# Patient Record
Sex: Male | Born: 1965
Health system: Southern US, Community
[De-identification: ages and names within clinical notes are randomized; demographics above are authoritative.]

## PROBLEM LIST (undated history)

## (undated) DIAGNOSIS — I10 Essential (primary) hypertension: Secondary | ICD-10-CM

## (undated) DIAGNOSIS — R55 Syncope and collapse: Secondary | ICD-10-CM

## (undated) HISTORY — PX: VASECTOMY: SHX75

## (undated) HISTORY — PX: APPENDECTOMY: SHX54

## (undated) HISTORY — DX: Syncope and collapse: R55

---

## 2000-12-21 ENCOUNTER — Emergency Department (HOSPITAL_COMMUNITY): Admission: EM | Admit: 2000-12-21 | Discharge: 2000-12-21 | Payer: Self-pay | Admitting: Emergency Medicine

## 2001-07-31 ENCOUNTER — Other Ambulatory Visit: Admission: RE | Admit: 2001-07-31 | Discharge: 2001-07-31 | Payer: Self-pay | Admitting: Urology

## 2012-02-19 DIAGNOSIS — R55 Syncope and collapse: Secondary | ICD-10-CM | POA: Insufficient documentation

## 2012-02-19 HISTORY — DX: Syncope and collapse: R55

## 2012-02-27 HISTORY — PX: TRANSTHORACIC ECHOCARDIOGRAM: SHX275

## 2013-05-27 ENCOUNTER — Other Ambulatory Visit: Payer: Self-pay | Admitting: Family Medicine

## 2013-05-27 DIAGNOSIS — M542 Cervicalgia: Secondary | ICD-10-CM

## 2013-05-27 DIAGNOSIS — M5412 Radiculopathy, cervical region: Secondary | ICD-10-CM

## 2013-06-01 ENCOUNTER — Ambulatory Visit
Admission: RE | Admit: 2013-06-01 | Discharge: 2013-06-01 | Disposition: A | Discharge status: Home / Self Care | Source: Ambulatory Visit | Attending: Family Medicine | Admitting: Family Medicine

## 2013-06-01 DIAGNOSIS — M5412 Radiculopathy, cervical region: Secondary | ICD-10-CM

## 2013-06-01 DIAGNOSIS — M542 Cervicalgia: Secondary | ICD-10-CM

## 2013-12-28 ENCOUNTER — Encounter: Payer: Self-pay | Admitting: Cardiovascular Disease

## 2013-12-28 ENCOUNTER — Ambulatory Visit (INDEPENDENT_AMBULATORY_CARE_PROVIDER_SITE_OTHER): Admitting: Cardiovascular Disease

## 2013-12-28 VITALS — BP 116/84 | HR 64 | Resp 16 | Ht 71.0 in | Wt 200.3 lb

## 2013-12-28 DIAGNOSIS — R000 Tachycardia, unspecified: Secondary | ICD-10-CM

## 2013-12-28 DIAGNOSIS — R002 Palpitations: Secondary | ICD-10-CM

## 2013-12-28 NOTE — Patient Instructions (Signed)
Your physician has recommended that you wear an event monitor for 30 days.. Event monitors are medical devices that record the heart's electrical activity. Doctors most often [place] these monitors to diagnose arrhythmias. Arrhythmias are problems with the speed or rhythm of the heartbeat. The monitor is a small, portable device. You can wear one while you do your normal daily activities. This is usually used to diagnose what is causing palpitations/syncope (passing out).   Your physician recommends that you schedule a follow-up appointment in: AS NEEDED

## 2013-12-29 DIAGNOSIS — R002 Palpitations: Secondary | ICD-10-CM | POA: Insufficient documentation

## 2013-12-29 NOTE — Progress Notes (Signed)
Patient ID: Juan Frank, male   DOB: Sep 17, 1966, 48 y.o.   MRN: [id number]     Reason for office visit Palpitations  Juan Frank returns with complaints similar to those from 2 years ago. He was out of town on a work-related trip and was sitting down to have lunch when he expressed very abrupt onset of a rapid heartbeat. He felt his heart was racing he believes the rhythm was regular. He did not have dizziness or syncope but felt drained of energy. He did not feel short of breath or have any chest discomfort. The symptoms when on all evening and he was still not feeling well the following morning. He did not check his heart rate when the symptoms began but the following morning his heart rate was 80 beats per minute. While this is not an abnormal value it is different from his usual heart rate of 50-60 beats per minute. He had taken Flexeril for a stiff neck about 36 hours earlier but otherwise he has been in good health and has no other complaints. Right now he is back to normal. During this time he has continued to exercise regularly and never feels poorly while he is on the treadmill or lifting weights. 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  May of 2013 he wore a 24-hour Holter monitor which was completely normal  Allergies  Allergen Reactions  . Penicillins Rash    Current Outpatient Prescriptions  Medication Sig Dispense Refill  . cyclobenzaprine (FLEXERIL) 10 MG tablet Take 10 mg by mouth 3 (three) times daily as needed for muscle spasms.      . naproxen sodium (ANAPROX) 220 MG tablet Take 220 mg by mouth as needed.       No current facility-administered medications for this visit.    No past medical illness  Surgical history significant for appendectomy and vasectomy  Family history significant for diabetes in his mother and sister. His sister also had stroke and syndrome his maternal grandmother had a stroke in her late 65s  History   Social History  .  Marital Status:  married, 2 children     Spouse Name: N/A    Number of Children: N/A  . Years of Education: N/A   Occupational History  . Not on file.   Social History Main Topics  . Smoking status: Never Smoker   . Smokeless tobacco: No  . Alcohol Use: No  . Drug Use: No  . Sexual Activity: Not on file    Review of systems: The patient specifically denies any chest pain at rest or with exertion, dyspnea at rest or with exertion, orthopnea, paroxysmal nocturnal dyspnea, syncope, focal neurological deficits, intermittent claudication, lower extremity edema, unexplained weight gain, cough, hemoptysis or wheezing.  The patient also denies abdominal pain, nausea, vomiting, dysphagia, diarrhea, constipation, polyuria, polydipsia, dysuria, hematuria, frequency, urgency, abnormal bleeding or bruising, fever, chills, unexpected weight changes, mood swings, change in skin or hair texture, change in voice quality, auditory or visual problems, allergic reactions or rashes, new musculoskeletal complaints other than usual "aches and pains".   PHYSICAL EXAM BP 116/84  Pulse 64  Resp 16  Ht 5\' 11"  (1.803 m)  Wt 90.855 kg (200 lb 4.8 oz)  BMI 27.95 kg/m2  General: Alert, oriented x3, no distress Head: no evidence of trauma, PERRL, EOMI, no exophtalmos or lid lag, no myxedema, no xanthelasma; normal ears, nose and oropharynx Neck: normal jugular venous pulsations and no hepatojugular reflux;  brisk carotid pulses without delay and no carotid bruits Chest: clear to auscultation, no signs of consolidation by percussion or palpation, normal fremitus, symmetrical and full respiratory excursions Cardiovascular: normal position and quality of the apical impulse, regular rhythm, normal first and second heart sounds, no murmurs, rubs or gallops Abdomen: no tenderness or distention, no masses by palpation, no abnormal pulsatility or arterial bruits, normal bowel sounds, no hepatosplenomegaly Extremities: no  clubbing, cyanosis or edema; 2+ radial, ulnar and brachial pulses bilaterally; 2+ right femoral, posterior tibial and dorsalis pedis pulses; 2+ left femoral, posterior tibial and dorsalis pedis pulses; no subclavian or femoral bruits Neurological: grossly nonfocal   EKG: Sinus rhythm, normal   ASSESSMENT AND PLAN  Review of of the abrupt onset of his palpitations, Juan Frank may indeed have an arrhythmia. I recommended that he wore a 30 day event monitor. This may be nondiagnostic since his episodes occur less than once a year. I do not think the symptoms are severe enough to justify an implantable loop recorder. I see no reason to restrict his physical activity. He had a normal echocardiogram in 2013 without any evidence of structural heart disease. It also does not appear to be justified to put him on any pharmacological treatment. We'll discuss further workup based on the findings on the monitor.  Orders Placed This Encounter  Procedures  . EKG 12-Lead  . Cardiac event monitor   Meds ordered this encounter  Medications  . cyclobenzaprine (FLEXERIL) 10 MG tablet    Sig: Take 10 mg by mouth 3 (three) times daily as needed for muscle spasms.  . naproxen sodium (ANAPROX) 220 MG tablet    Sig: Take 220 mg by mouth as needed.    Holli Humbles, MD, [place] [phone] office [phone] pager

## 2014-02-02 ENCOUNTER — Other Ambulatory Visit: Payer: Self-pay | Admitting: *Deleted

## 2014-02-02 DIAGNOSIS — R000 Tachycardia, unspecified: Secondary | ICD-10-CM

## 2014-02-02 DIAGNOSIS — R002 Palpitations: Secondary | ICD-10-CM

## 2015-03-22 ENCOUNTER — Encounter: Payer: Self-pay | Admitting: *Deleted

## 2015-03-28 ENCOUNTER — Encounter: Payer: Self-pay | Admitting: Cardiovascular Disease

## 2015-08-30 ENCOUNTER — Encounter: Payer: Self-pay | Admitting: Cardiovascular Disease

## 2015-08-30 ENCOUNTER — Ambulatory Visit (INDEPENDENT_AMBULATORY_CARE_PROVIDER_SITE_OTHER): Admitting: Cardiovascular Disease

## 2015-08-30 VITALS — BP 102/80 | HR 70 | Resp 16 | Ht 71.0 in | Wt 199.3 lb

## 2015-08-30 DIAGNOSIS — R55 Syncope and collapse: Secondary | ICD-10-CM

## 2015-08-30 DIAGNOSIS — R002 Palpitations: Secondary | ICD-10-CM | POA: Diagnosis not present

## 2015-08-30 NOTE — Progress Notes (Signed)
Patient ID: Juan Frank, male   DOB: 11/17/1965, 49 y.o.   MRN: [id number]     Cardiology Office Note   Date:  08/30/2015   ID:  Juan Frank, DOB [birth year], MRN [id number]  PCP:  Juan Frees, MD  Cardiologist:   Juan Klein, MD   Chief Complaint  Patient presents with  . Follow-up  . Chest Pain    a little soreness but states it may be coming from his neck  . Shortness of Breath    a little, sometimes light headedness and dizzy  . Edema    no edema      History of Present Illness: Juan Frank is a 49 y.o. male who presents for similar complaints as in the past : Near-syncope and palpitations. He was driving to pick his daughter up from college on Thanksgiving day when he noticed his heart rate increasing. The symptoms got progressively worse and he felt "drained". When he reached her home he had to sit down since he felt so weak. His heart rate which she reports is usually around 60 bpm was as high as 97 bpm when he measured it with his smart phone app. He  Try to lie down but felt even worse with his heart rate accelerating further. He called 911 but then gradually began to improve before they arrived and he told him to turn around.  He reports that his palpitations began abruptly while he was in the car , but then they resolved gradually. That morning he had not had breakfast although he had had coffee and drank a diet soda. This is not atypical for him in the mornings.  A similar event occurred several months ago after he had been working out in the yard for a few hours.  It was associated with severe dizziness but he did not lose consciousness.  His wife states that he looks drained for a day or 2 after one of these events, although he reports feeling back to normal quickly.   Juan Frank had an extensive workup in 2015 with treadmill stress testing, echocardiography and an event monitor, all results unrevealing. He is very physically active and fit and exercises  regularly. He does not have any symptoms while exercising , although on one occasion he felt poorly when he stopped exercising.   he has recently had some issues with deep sleep and dreams from which is hard to awake. His wife says that he mumbles with eyes half open. They mention a possible diagnosis of "sleep paralysis". His only other complaint remains stiffness in the left side of his neck for which he has taken Flexeril  Past Medical History  Diagnosis Date  . Pre-syncope 02/19/2012    Past Surgical History  Procedure Laterality Date  . Appendectomy    . Vasectomy    . Transthoracic echocardiogram  02/27/2012    EF =>55%. ATRIAL SEPTUM IS ANEURYSMAL. [place].     Current Outpatient Prescriptions  Medication Sig Dispense Refill  . cyclobenzaprine (FLEXERIL) 10 MG tablet Take 10 mg by mouth 3 (three) times daily as needed for muscle spasms.     No current facility-administered medications for this visit.    Allergies:   Penicillins    Social History:  The patient  reports that he has never smoked. He does not have any smokeless tobacco history on file. He reports that he does not drink alcohol or use illicit drugs.   Family History:  The patient's family history  includes Diabetes in his father, mother, and sister; Sjogren's syndrome in his sister; Stroke in his maternal grandmother.    ROS:  Please see the history of present illness.    Otherwise, review of systems positive for none.   All other systems are reviewed and negative.    PHYSICAL EXAM: VS:  BP 102/80 mmHg  Pulse 70  Resp 16  Ht 5\' 11"  (1.803 m)  Wt 199 lb 4.8 oz (90.402 kg)  BMI 27.81 kg/m2 , BMI Body mass index is 27.81 kg/(m^2).  General: Alert, oriented x3, no distress Head: no evidence of trauma, PERRL, EOMI, no exophtalmos or lid lag, no myxedema, no xanthelasma; normal ears, nose and oropharynx Neck: normal jugular venous pulsations and no hepatojugular reflux; brisk  carotid pulses without delay and no carotid bruits Chest: clear to auscultation, no signs of consolidation by percussion or palpation, normal fremitus, symmetrical and full respiratory excursions Cardiovascular: normal position and quality of the apical impulse, regular rhythm, normal first and second heart sounds, no murmurs, rubs or gallops Abdomen: no tenderness or distention, no masses by palpation, no abnormal pulsatility or arterial bruits, normal bowel sounds, no hepatosplenomegaly Extremities: no clubbing, cyanosis or edema; 2+ radial, ulnar and brachial pulses bilaterally; 2+ right femoral, posterior tibial and dorsalis pedis pulses; 2+ left femoral, posterior tibial and dorsalis pedis pulses; no subclavian or femoral bruits Neurological: grossly nonfocal Psych: euthymic mood, full affect   EKG:  EKG is ordered today. The ekg ordered today demonstrates  Normal sinus rhythm, QTC 425 ms. Normal tracing   Recent Labs: No results found for requested labs within last 365 days.    Lipid Panel No results found for: CHOL, TRIG, HDL, CHOLHDL, VLDL, LDLCALC, LDLDIRECT    Wt Readings from Last 3 Encounters:  08/30/15 199 lb 4.8 oz (90.402 kg)  12/28/13 200 lb 4.8 oz (90.855 kg)      ASSESSMENT AND PLAN:  Juan Frank not have any evidence of structural heart disease by his workup last year. His event monitor was nondiagnostic.  His palpitations' onset is very abrupt, but they resolve gradually and while his heart rate increases substantially from his baseline, it does not really enter tachycardia range.  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 I have recommended that he increase his intake of sodium and water. He is generally careful to avoid salt due to a family history of high blood pressure. I have recommended that he have a tilt table test first to help confirm this diagnosis. On the other hand, some of  his features might indeed suggest an arrhythmia (the rapid onset of his palpitations) , and if the tilt table test is negative , I did recommend that he have an implantable loop recorder. We discussed the fact that the tilt table test is not particular sensitive or specific.   Most importantly, asked him not to ignore these symptoms which may be the prodrome of full blown syncope. If he is driving when they occur he should immediately pull over.    Current medicines are reviewed at length with the patient today.  The patient does not have concerns regarding medicines.  The following changes have been made:  no change  Labs/ tests ordered today include:  Orders Placed This Encounter  Procedures  . EKG 12-Lead  . TILT TABLE STUDY  . LOOP RECORDER IMPLANT    Patient Instructions  Your physician has recommended that you have  a tilt table test. This test is sometimes used to help determine the cause of fainting spells. You lie on a table that moves from a lying down to an upright position. The change in position can bring on loss of consciousness. The doctor monitors your symptoms, heart rate, EKG, and blood pressure throughout the test. The doctor also may give you a medicine and then monitor your response to the medicine. This is done in the hospital and usually takes half of a day to complete the procedure. Please see the instruction sheet given to you today for more information.  Your physician has recommended that you have a loop recorder placed on the same day, after your tilt table test.   Both the test and loop recorder will be done at [facility].  Dr Sallyanne Kuster recommends that you schedule a follow-up appointment first available.     Mikael Spray, MD  08/30/2015 11:42 AM    Juan Klein, MD, [facility], [place] [facility] [phone] office [phone] pager

## 2015-08-30 NOTE — Patient Instructions (Signed)
Your physician has recommended that you have a tilt table test. This test is sometimes used to help determine the cause of fainting spells. You lie on a table that moves from a lying down to an upright position. The change in position can bring on loss of consciousness. The doctor monitors your symptoms, heart rate, EKG, and blood pressure throughout the test. The doctor also may give you a medicine and then monitor your response to the medicine. This is done in the hospital and usually takes half of a day to complete the procedure. Please see the instruction sheet given to you today for more information.  Your physician has recommended that you have a loop recorder placed on the same day, after your tilt table test.   Both the test and loop recorder will be done at [facility].  Dr Sallyanne Kuster recommends that you schedule a follow-up appointment first available.

## 2015-09-01 ENCOUNTER — Other Ambulatory Visit: Payer: Self-pay

## 2015-09-01 DIAGNOSIS — R55 Syncope and collapse: Secondary | ICD-10-CM

## 2015-10-10 ENCOUNTER — Ambulatory Visit (HOSPITAL_COMMUNITY)
Admission: RE | Admit: 2015-10-10 | Discharge: 2015-10-10 | Disposition: A | Discharge status: Home / Self Care | Source: Ambulatory Visit | Attending: Cardiovascular Disease | Admitting: Cardiovascular Disease

## 2015-10-10 ENCOUNTER — Encounter (HOSPITAL_COMMUNITY): Payer: Self-pay | Admitting: Cardiovascular Disease

## 2015-10-10 ENCOUNTER — Encounter (HOSPITAL_COMMUNITY)
Admission: RE | Disposition: A | Discharge status: Home / Self Care | Payer: Self-pay | Source: Ambulatory Visit | Attending: Cardiovascular Disease

## 2015-10-10 DIAGNOSIS — Z8249 Family history of ischemic heart disease and other diseases of the circulatory system: Secondary | ICD-10-CM | POA: Insufficient documentation

## 2015-10-10 DIAGNOSIS — I638 Other cerebral infarction: Secondary | ICD-10-CM | POA: Diagnosis not present

## 2015-10-10 DIAGNOSIS — Z880 Allergy status to penicillin: Secondary | ICD-10-CM | POA: Diagnosis not present

## 2015-10-10 DIAGNOSIS — R55 Syncope and collapse: Secondary | ICD-10-CM | POA: Insufficient documentation

## 2015-10-10 DIAGNOSIS — R002 Palpitations: Secondary | ICD-10-CM | POA: Diagnosis not present

## 2015-10-10 DIAGNOSIS — Z823 Family history of stroke: Secondary | ICD-10-CM | POA: Insufficient documentation

## 2015-10-10 HISTORY — PX: EP IMPLANTABLE DEVICE: SHX172B

## 2015-10-10 HISTORY — PX: ELECTROPHYSIOLOGIC STUDY: SHX172A

## 2015-10-10 SURGERY — TILT TABLE STUDY
Anesthesia: LOCAL

## 2015-10-10 MED ORDER — DEXTROSE 5 % IV SOLN
INTRAVENOUS | Status: DC
  Administered 2015-10-10: 07:00:00 via INTRAVENOUS

## 2015-10-10 MED ORDER — LIDOCAINE-EPINEPHRINE 1 %-1:100000 IJ SOLN
INTRAMUSCULAR | Status: AC
  Filled 2015-10-10: qty 1

## 2015-10-10 MED ORDER — NITROGLYCERIN 0.4 MG/SPRAY TL SOLN
Status: DC | PRN
  Administered 2015-10-10: 1 via SUBLINGUAL

## 2015-10-10 MED ORDER — NITROGLYCERIN 0.4 MG/SPRAY TL SOLN
Status: AC
  Filled 2015-10-10: qty 4.9

## 2015-10-10 SURGICAL SUPPLY — 3 items
ELECT DEFIB PAD ADLT CADENCE (PAD) ×1 IMPLANT
LOOP REVEAL LINQSYS (Prosthesis & Implant Heart) ×1 IMPLANT
PACK LOOP INSERTION (CUSTOM PROCEDURE TRAY) ×2 IMPLANT

## 2015-10-10 NOTE — H&P (Signed)
Chief Complaint:  Recurrent Near-syncope  HPI:  This is a 50 y.o. male with a past medical history significant for the absence of serious medical illness. He has had recurrent near-syncope.  He was driving to pick his daughter up from college on Thanksgiving day when he noticed his heart rate increasing. The symptoms got progressively worse and he felt "drained". When he reached her home he had to sit down since he felt so weak. His heart rate which she reports is usually around 60 bpm was as high as 97 bpm when he measured it with his smart phone app. Hetried to lie down but felt even worse with his heart rate accelerating further. He called 911 but then gradually began to improve before they arrived and he told them to turn around. He reports that his palpitations began abruptly while he was in the car , but then they resolved gradually. That morning he had not had breakfast although he had had coffee and drank a diet soda. This is not atypical for him in the mornings.  A similar event occurred several months ago after he had been working out in the yard for a few hours. It was associated with severe dizziness but he did not lose consciousness. His wife states that he looks drained for a day or 2 after one of these events, although he reports feeling back to normal quickly.  Sayvion had an extensive workup in 2015 with treadmill stress testing, echocardiography and an event monitor, all results unrevealing. He is very physically active and fit and exercises regularly. He does not have any symptoms while exercising , although on one occasion he felt poorly when he stopped exercising.  he has recently had some issues with deep sleep and dreams from which is hard to awake. His wife says that he mumbles with eyes half open. They mention a possible diagnosis of "sleep paralysis". His only other complaint remains stiffness in the left side of his neck for which he has taken Flexeril   PMHx:  Past  Medical History  Diagnosis Date  . Pre-syncope 02/19/2012    Past Surgical History  Procedure Laterality Date  . Appendectomy    . Vasectomy    . Transthoracic echocardiogram  02/27/2012    EF =>55%. ATRIAL SEPTUM IS ANEURYSMAL. [place].    FAMHx:  Family History  Problem Relation Age of Onset  . Diabetes Mother   . Diabetes Father   . Diabetes Sister   . Stroke Maternal Grandmother   . Sjogren's syndrome Sister     SOCHx:   reports that he has never smoked. He does not have any smokeless tobacco history on file. He reports that he does not drink alcohol or use illicit drugs.  ALLERGIES:  Penicillin - rash   ROS: The patient specifically denies any chest pain at rest or with exertion, dyspnea at rest or with exertion, orthopnea, paroxysmal nocturnal dyspnea, focal neurological deficits, intermittent claudication, lower extremity edema, unexplained weight gain, cough, hemoptysis or wheezing.  The patient also denies abdominal pain, nausea, vomiting, dysphagia, diarrhea, constipation, polyuria, polydipsia, dysuria, hematuria, frequency, urgency, abnormal bleeding or bruising, fever, chills, unexpected weight changes, mood swings, change in skin or hair texture, change in voice quality, auditory or visual problems, allergic reactions or rashes, new musculoskeletal complaints other than usual "aches and pains".  HOME MEDS: Medications Prior to Admission  Medication Sig Dispense Refill  . cyclobenzaprine (FLEXERIL) 10 MG tablet Take 10 mg by mouth 3 (  three) times daily as needed for muscle spasms.    Marland Kitchen ibuprofen (ADVIL,MOTRIN) 200 MG tablet Take 600 mg by mouth every 6 (six) hours as needed for moderate pain.    . naproxen sodium (ANAPROX) 220 MG tablet Take 440 mg by mouth 2 (two) times daily with a meal.      VITALS: Blood pressure 133/84, pulse 69, temperature 97.5 F (36.4 C), temperature source Oral, resp. rate 16, SpO2 100 %.  EXAM:  General:  Alert, oriented x3, no distress Head: no evidence of trauma, PERRL, EOMI, no exophtalmos or lid lag, no myxedema, no xanthelasma; normal ears, nose and oropharynx Neck: normal jugular venous pulsations and no hepatojugular reflux; brisk carotid pulses without delay and no carotid bruits Chest: clear to auscultation, no signs of consolidation by percussion or palpation, normal fremitus, symmetrical and full respiratory excursions Cardiovascular: normal position and quality of the apical impulse, regular rhythm, normal first heart sound and normal second heart sounds, no rubs or gallops, no murmur Abdomen: no tenderness or distention, no masses by palpation, no abnormal pulsatility or arterial bruits, normal bowel sounds, no hepatosplenomegaly Extremities: no clubbing, cyanosis or edema; 2+ radial, ulnar and brachial pulses bilaterally; 2+ right femoral, posterior tibial and dorsalis pedis pulses; 2+ left femoral, posterior tibial and dorsalis pedis pulses; no subclavian or femoral bruits Neurological: grossly nonfocal   IMPRESSION: Unexplained near-syncope and palpitations.  Daymein not have any evidence of structural heart disease by his workup last year. His event monitor was nondiagnostic. His palpitations' onset is very abrupt, but they resolve gradually and while his heart rate increases substantially from his baseline, it does not really enter tachycardia range.  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 I have recommended that he increase his intake of sodium and water. He is generally careful to avoid salt due to a family history of high blood pressure. I have recommended that he have a tilt table test first to help confirm this diagnosis. On the other hand, some of his features might indeed suggest an arrhythmia (the rapid onset of his palpitations) , and if the tilt table test is negative , I did recommend that  he have an implantable loop recorder. We discussed the fact that the tilt table test is not particular sensitive or specific.  Most importantly, asked him not to ignore these symptoms which may be the prodrome of full blown syncope. If he is driving when they occur he should immediately pull over.   PLAN: - Tilt table test - if tilt test is nondiagnostic, implantable loop recorder.  This procedure has been fully reviewed with the patient and written informed consent has been obtained.   Sanda Klein, MD, [facility] [phone] office [phone] pager  10/10/2015, 8:03 AM

## 2015-10-10 NOTE — Op Note (Signed)
Procedure performed: Head up tilt table test  Procedure performed by: Sanda Klein, MD, [facility]  Reason for procedure: Recurrent syncope    This procedure has been fully reviewed with the patient and written informed consent has been obtained.  The patient was initially monitored in the supine position until achievement of hemodynamic steady state. At this point the patient's blood pressure was 129/89 and the patient's heart rate was 61pm.  The patient was subsequently raised to the 70 head up tilt position. There was no significant change in hemodynamic parameters immediately following the upright position.  Continuous hemodynamic monitoring using arm cuff sphygmomanometry and electrocardiography was performed for a total of 27 minutes. During this time the patient did not have any complaints.   Subsequently a single dose of 0.4 mg nitroglycerin was administered in the form of a spray. During continued hemodynamic monitoring there was an expected increase in heart rate and decrease in blood pressure that occurred gradually. As the effects wore off, heart rate and BP returned to original values. No symptoms of near-syncope occurred.  At the end of the procedure the patient was returned to the supine position and monitored until vital signs reached her baseline values.  Conclusion:  Normal head up tilt table test.  Sanda Klein, MD, [facility] [phone] office [phone] pager

## 2015-10-10 NOTE — CV Procedure (Signed)
LOOP RECORDER IMPLANT   Procedure report  Procedure performed:  Loop recorder implantation   Reason for procedure:  1. Recurrent syncope/near-syncope 2. Cryptogenic stroke Procedure performed by:  Sanda Klein, MD  Complications:  None  Estimated blood loss:  <5 mL  Medications administered during procedure:  Lidocaine 1% with 1/10,000 epinephrine 10 mL locally Device details:  Medtronic Reveal Linq model number [id number], serial number [id number] Procedure details:  After the risks and benefits of the procedure were discussed the patient provided informed consent. The patient was prepped and draped in usual sterile fashion. Local anesthesia was administered to an area 2 cm to the left of the sternum in the 4th intercostal space. A cutaneous incision was made using the incision tool. The introducer was then used to create a subcutaneous tunnel and carefully deploy the device. Local pressure was held to ensure hemostasis.  The incision was closed with SteriStrips and a sterile dressing was applied.  R waves  0.8 mV  Sanda Klein, MD, [facility] [phone] office [phone] pager 10/10/2015 8:27 AM

## 2015-10-19 ENCOUNTER — Ambulatory Visit (INDEPENDENT_AMBULATORY_CARE_PROVIDER_SITE_OTHER): Admitting: *Deleted

## 2015-10-19 DIAGNOSIS — R55 Syncope and collapse: Secondary | ICD-10-CM

## 2015-10-19 DIAGNOSIS — R002 Palpitations: Secondary | ICD-10-CM

## 2015-10-19 LAB — CUP PACEART INCLINIC DEVICE CHECK: Date Time Interrogation Session: 20170119093207

## 2015-10-19 NOTE — Progress Notes (Signed)
Loop wound check appointment. Steri-strips previously removed by patient. Wound without redness or edema. Incision edges well approximated, wound well healed. Battery status: good. R-waves 0.9-1.04mV. No symptom, tachy, pause, brady, or AF episodes. Monthly summary reports and ROV with [place] in 3 months.

## 2015-11-09 ENCOUNTER — Ambulatory Visit (INDEPENDENT_AMBULATORY_CARE_PROVIDER_SITE_OTHER): Admitting: *Deleted

## 2015-11-09 DIAGNOSIS — R55 Syncope and collapse: Secondary | ICD-10-CM | POA: Diagnosis not present

## 2015-11-09 NOTE — Progress Notes (Signed)
[facility] Summary Report / Loop Recorder 

## 2015-11-27 ENCOUNTER — Encounter: Payer: Self-pay | Admitting: Cardiovascular Disease

## 2015-11-30 ENCOUNTER — Other Ambulatory Visit: Payer: Self-pay | Admitting: Family Medicine

## 2015-11-30 DIAGNOSIS — M5412 Radiculopathy, cervical region: Secondary | ICD-10-CM

## 2015-12-06 LAB — CUP PACEART REMOTE DEVICE CHECK: Date Time Interrogation Session: 20170209153532

## 2015-12-06 NOTE — Progress Notes (Signed)
[facility] summary report received. Battery status OK. Normal device function. No new symptom episodes, tachy episodes, brady, or pause episodes. No new AF episodes. Monthly summary reports and ROV/PRN 

## 2015-12-11 ENCOUNTER — Ambulatory Visit (INDEPENDENT_AMBULATORY_CARE_PROVIDER_SITE_OTHER): Admitting: *Deleted

## 2015-12-11 DIAGNOSIS — R55 Syncope and collapse: Secondary | ICD-10-CM

## 2015-12-11 NOTE — Progress Notes (Signed)
[facility] Summary Report / Loop Recorder 

## 2015-12-15 ENCOUNTER — Ambulatory Visit
Admission: RE | Admit: 2015-12-15 | Discharge: 2015-12-15 | Disposition: A | Discharge status: Home / Self Care | Source: Ambulatory Visit | Attending: Family Medicine | Admitting: Family Medicine

## 2015-12-15 DIAGNOSIS — M5412 Radiculopathy, cervical region: Secondary | ICD-10-CM

## 2015-12-29 ENCOUNTER — Telehealth: Payer: Self-pay | Admitting: Cardiology

## 2015-12-29 NOTE — Telephone Encounter (Signed)
LMOVM requesting that pt send manual transmission b/c home monitor has not updated in at least 14 days.    

## 2016-01-01 DIAGNOSIS — Z4509 Encounter for adjustment and management of other cardiac device: Secondary | ICD-10-CM | POA: Insufficient documentation

## 2016-01-01 NOTE — Progress Notes (Signed)
Patient ID: Juan Frank, male   DOB: [birth year], 50 y.o.   MRN: [id number]    Cardiology Office Note    Date:  01/02/2016   ID:  Juan Frank, DOB 01-13-1966, MRN [id number]  PCP:  Shirline Frees, MD  Cardiologist:   Sanda Klein, MD   Chief Complaint  Patient presents with  . Follow-up    PACER CHECK. Patient has no complaints.    History of Present Illness:  Juan Frank is a 50 y.o. male with palpitations and near-syncope, here for loop recorder check, roughly 2 months after implantation.   Juan Frank has not had any episodes of near syncope since loop recorder implantation. He had one minor event when he walks the symptoms may be starting, but they never really came to full development. His loop recorder showed normal sinus rhythm at the time. He continues to have episodes of fatigue and relative tachycardia (for him that means a heart rate in the 80s), which not infrequently occur after meals. He exercises regularly and never has any complaints during physical activity. He is trying to drink more water.  Interrogation of his loop recorder shows no new events. His heart rates is in the 50s today as usual. The surgical site is well-healed.  Juan Frank had an extensive workup in 2015 with treadmill stress testing, echocardiography and an event monitor, all results unrevealing. He is very physically active and fit and exercises regularly. He does not have any symptoms while exercising , although on one occasion he felt poorly when he stopped exercising.  He is having some problems with a suspected tension nerve in his cervical spine. He has had an MRI and is to see a neurosurgeon soon.  Past Medical History  Diagnosis Date  . Pre-syncope 02/19/2012    Past Surgical History  Procedure Laterality Date  . Appendectomy    . Vasectomy    . Transthoracic echocardiogram  02/27/2012    EF =>55%. ATRIAL SEPTUM IS ANEURYSMAL. [name].  Marland Kitchen Electrophysiologic  study N/A 10/10/2015    Procedure: Tilt Table Study;  Surgeon: Sanda Klein, MD;  Location: [facility];  Service: Cardiovascular;  Laterality: N/A;  . Ep implantable device N/A 10/10/2015    Procedure: Loop Recorder Insertion;  Surgeon: Sanda Klein, MD;  Location: [facility];  Service: Cardiovascular;  Laterality: N/A;    Current Medications: Outpatient Prescriptions Prior to Visit  Medication Sig Dispense Refill  . cyclobenzaprine (FLEXERIL) 10 MG tablet Take 10 mg by mouth 3 (three) times daily as needed for muscle spasms.    Marland Kitchen ibuprofen (ADVIL,MOTRIN) 200 MG tablet Take 600 mg by mouth every 6 (six) hours as needed for moderate pain.    . naproxen sodium (ANAPROX) 220 MG tablet Take 440 mg by mouth 2 (two) times daily as needed.      No facility-administered medications prior to visit.     Allergies:   Penicillins   Social History   Social History  . Marital Status: Single    Spouse Name: N/A  . Number of Children: N/A  . Years of Education: N/A   Social History Main Topics  . Smoking status: Never Smoker   . Smokeless tobacco: None  . Alcohol Use: No  . Drug Use: No  . Sexual Activity: Not Asked   Other Topics Concern  . None   Social History Narrative     Family History:  The patient's family history includes Diabetes in his father, mother, and  sister; Sjogren's syndrome in his sister; Stroke in his maternal grandmother.   ROS:   Please see the history of present illness.    ROS All other systems reviewed and are negative.   PHYSICAL EXAM:   VS:  BP 125/83 mmHg  Pulse 57  Ht 5\' 11"  (1.803 m)  Wt 92.08 kg (203 lb)  BMI 28.33 kg/m2   GEN: Well nourished, well developed, in no acute distress HEENT: normal Neck: no JVD, carotid bruits, or masses Cardiac: RRR; no murmurs, rubs, or gallops,no edema  Respiratory:  clear to auscultation bilaterally, normal work of breathing GI: soft, nontender, nondistended, + BS MS: no deformity or  atrophy Skin: warm and dry, no rash Neuro:  Alert and Oriented x 3, Strength and sensation are intact Psych: euthymic mood, full affect  Wt Readings from Last 3 Encounters:  01/02/16 92.08 kg (203 lb)  08/30/15 90.402 kg (199 lb 4.8 oz)  12/28/13 90.855 kg (200 lb 4.8 oz)      Studies/Labs Reviewed:   EKG:  EKG is ordered today.  The ekg ordered today demonstrates Sinus bradycardia otherwise normal  Recent Labs: No results found for requested labs within last 365 days.   Lipid Panel No results found for: CHOL, TRIG, HDL, CHOLHDL, VLDL, LDLCALC, LDLDIRECT    ASSESSMENT:    1. Pre-syncope   2. Encounter for loop recorder check      PLAN:  In order of problems listed above:  1. No new events 2. Continue loop recorder monthly, yearly office visit    Medication Adjustments/Labs and Tests Ordered: Current medicines are reviewed at length with the patient today.  Concerns regarding medicines are outlined above.  Medication changes, Labs and Tests ordered today are listed in the Patient Instructions below. Patient Instructions  Dr Sallyanne Kuster recommends that you continue on your current medications as directed. Please refer to the Current Medication list given to you today.  Remote monitoring is used to monitor your Pacemaker of ICD from home. This monitoring reduces the number of office visits required to check your device to one time per year. It allows [place] to keep an eye on the functioning of your device to ensure it is working properly. You are scheduled for a device check from home on Feb 02, 2016. You may send your transmission at any time that day. If you have a wireless device, the transmission will be sent automatically. After your physician reviews your transmission, you will receive a postcard with your next transmission date.  Dr Sallyanne Kuster recommends that you schedule a follow-up appointment in 1 year with device check. You will receive a reminder letter in the mail two  months in advance. If you don't receive a letter, please call our office to schedule the follow-up appointment.  If you need a refill on your cardiac medications before your next appointment, please call your pharmacy.     Mikael Spray, MD  01/02/2016 9:21 AM    [facility] [address], [place], [place]  [address] Phone: [phone]; Fax: [phone]

## 2016-01-02 ENCOUNTER — Encounter: Payer: Self-pay | Admitting: Cardiovascular Disease

## 2016-01-02 ENCOUNTER — Ambulatory Visit (INDEPENDENT_AMBULATORY_CARE_PROVIDER_SITE_OTHER): Admitting: Cardiovascular Disease

## 2016-01-02 VITALS — BP 125/83 | HR 57 | Ht 71.0 in | Wt 203.0 lb

## 2016-01-02 DIAGNOSIS — Z4509 Encounter for adjustment and management of other cardiac device: Secondary | ICD-10-CM

## 2016-01-02 DIAGNOSIS — R55 Syncope and collapse: Secondary | ICD-10-CM

## 2016-01-02 LAB — CUP PACEART INCLINIC DEVICE CHECK: MDC IDC SESS DTM: 20170404092450

## 2016-01-02 NOTE — Patient Instructions (Signed)
Dr Sallyanne Kuster recommends that you continue on your current medications as directed. Please refer to the Current Medication list given to you today.  Remote monitoring is used to monitor your Pacemaker of ICD from home. This monitoring reduces the number of office visits required to check your device to one time per year. It allows [place] to keep an eye on the functioning of your device to ensure it is working properly. You are scheduled for a device check from home on Feb 02, 2016. You may send your transmission at any time that day. If you have a wireless device, the transmission will be sent automatically. After your physician reviews your transmission, you will receive a postcard with your next transmission date.  Dr Sallyanne Kuster recommends that you schedule a follow-up appointment in 1 year with device check. You will receive a reminder letter in the mail two months in advance. If you don't receive a letter, please call our office to schedule the follow-up appointment.  If you need a refill on your cardiac medications before your next appointment, please call your pharmacy.

## 2016-01-08 ENCOUNTER — Ambulatory Visit (INDEPENDENT_AMBULATORY_CARE_PROVIDER_SITE_OTHER): Admitting: *Deleted

## 2016-01-08 ENCOUNTER — Encounter: Payer: Self-pay | Admitting: Cardiovascular Disease

## 2016-01-08 DIAGNOSIS — R55 Syncope and collapse: Secondary | ICD-10-CM

## 2016-01-08 NOTE — Progress Notes (Signed)
[facility] Summary Report / Loop Recorder 

## 2016-01-26 ENCOUNTER — Telehealth: Payer: Self-pay | Admitting: Cardiology

## 2016-01-26 NOTE — Telephone Encounter (Signed)
LMOVM requesting that pt send manual transmission b/c home monitor has not updated in at least 14 days.    

## 2016-01-29 ENCOUNTER — Telehealth: Payer: Self-pay | Admitting: Cardiology

## 2016-01-29 NOTE — Telephone Encounter (Signed)
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 Instructed pt wife that if pt could just send a manual transmission when he gets back home. Pt wife verbalized understanding.

## 2016-02-02 ENCOUNTER — Encounter: Payer: Self-pay | Admitting: Cardiology

## 2016-02-07 ENCOUNTER — Ambulatory Visit: Admitting: *Deleted

## 2016-02-07 DIAGNOSIS — R55 Syncope and collapse: Secondary | ICD-10-CM

## 2016-02-07 NOTE — Progress Notes (Signed)
[facility] Summary Report / Loop Recorder 

## 2016-02-21 LAB — CUP PACEART REMOTE DEVICE CHECK: MDC IDC SESS DTM: 20170311153824

## 2016-02-25 LAB — CUP PACEART REMOTE DEVICE CHECK: MDC IDC SESS DTM: 20170410160747

## 2016-02-25 NOTE — Progress Notes (Signed)
[facility] summary report received. Battery status OK. Normal device function. No new symptom episodes, tachy episodes, brady, or pause episodes. No new AF episodes. Monthly summary reports and ROV/PRN 

## 2016-03-08 ENCOUNTER — Ambulatory Visit (INDEPENDENT_AMBULATORY_CARE_PROVIDER_SITE_OTHER): Admitting: *Deleted

## 2016-03-08 DIAGNOSIS — R55 Syncope and collapse: Secondary | ICD-10-CM | POA: Diagnosis not present

## 2016-03-11 NOTE — Progress Notes (Signed)
[facility] Summary Report / Loop Recorder 

## 2016-03-15 LAB — CUP PACEART REMOTE DEVICE CHECK: MDC IDC SESS DTM: 20170609170710

## 2016-04-08 ENCOUNTER — Ambulatory Visit (INDEPENDENT_AMBULATORY_CARE_PROVIDER_SITE_OTHER): Admitting: *Deleted

## 2016-04-08 DIAGNOSIS — R55 Syncope and collapse: Secondary | ICD-10-CM

## 2016-04-08 NOTE — Progress Notes (Signed)
[facility] Summary Report / Loop Recorder 

## 2016-05-07 ENCOUNTER — Ambulatory Visit (INDEPENDENT_AMBULATORY_CARE_PROVIDER_SITE_OTHER): Admitting: *Deleted

## 2016-05-07 DIAGNOSIS — R55 Syncope and collapse: Secondary | ICD-10-CM

## 2016-05-07 NOTE — Progress Notes (Signed)
[facility] Summary Report / Loop Recorder 

## 2016-05-09 LAB — CUP PACEART REMOTE DEVICE CHECK: MDC IDC SESS DTM: 20170709180640

## 2016-05-14 LAB — CUP PACEART REMOTE DEVICE CHECK: Date Time Interrogation Session: 20170808180911

## 2016-06-06 ENCOUNTER — Ambulatory Visit (INDEPENDENT_AMBULATORY_CARE_PROVIDER_SITE_OTHER): Admitting: *Deleted

## 2016-06-06 DIAGNOSIS — R55 Syncope and collapse: Secondary | ICD-10-CM

## 2016-06-07 NOTE — Progress Notes (Signed)
[facility] Summary Report / Loop Recorder 

## 2016-07-06 LAB — CUP PACEART REMOTE DEVICE CHECK: Date Time Interrogation Session: 20170907180738

## 2016-07-06 NOTE — Progress Notes (Signed)
[facility] summary report received. Battery status OK. Normal device function. No new symptom episodes, tachy episodes, brady, or pause episodes. No new AF episodes. Monthly summary reports and ROV/PRN 

## 2016-07-08 ENCOUNTER — Ambulatory Visit (INDEPENDENT_AMBULATORY_CARE_PROVIDER_SITE_OTHER): Admitting: *Deleted

## 2016-07-08 DIAGNOSIS — R55 Syncope and collapse: Secondary | ICD-10-CM | POA: Diagnosis not present

## 2016-07-08 NOTE — Progress Notes (Signed)
[facility] Summary Report / Loop Recorder 

## 2016-08-05 ENCOUNTER — Ambulatory Visit (INDEPENDENT_AMBULATORY_CARE_PROVIDER_SITE_OTHER): Admitting: *Deleted

## 2016-08-05 DIAGNOSIS — R55 Syncope and collapse: Secondary | ICD-10-CM | POA: Diagnosis not present

## 2016-08-06 NOTE — Progress Notes (Signed)
[facility] Summary Report / Loop Recorder 

## 2016-08-11 LAB — CUP PACEART REMOTE DEVICE CHECK
Implantable Pulse Generator Implant Date: 20170110
MDC IDC SESS DTM: 20171007181019

## 2016-08-11 NOTE — Progress Notes (Signed)
[facility] summary report received. Battery status OK. Normal device function. No new symptom episodes, tachy episodes, brady, or pause episodes. No new AF episodes. Monthly summary reports and ROV/PRN 

## 2016-09-04 ENCOUNTER — Ambulatory Visit (INDEPENDENT_AMBULATORY_CARE_PROVIDER_SITE_OTHER): Admitting: *Deleted

## 2016-09-04 DIAGNOSIS — R55 Syncope and collapse: Secondary | ICD-10-CM

## 2016-09-05 NOTE — Progress Notes (Signed)
[facility] Summary Report / Loop Recorder 

## 2016-09-20 LAB — CUP PACEART REMOTE DEVICE CHECK
Date Time Interrogation Session: 20171106193617
MDC IDC PG IMPLANT DT: 20170110

## 2016-09-20 NOTE — Progress Notes (Signed)
[facility] summary report received. Battery status OK. Normal device function. No new symptom episodes, tachy episodes, brady, or pause episodes. No new AF episodes. Monthly summary reports and ROV/PRN 

## 2016-10-04 ENCOUNTER — Ambulatory Visit (INDEPENDENT_AMBULATORY_CARE_PROVIDER_SITE_OTHER): Admitting: *Deleted

## 2016-10-04 DIAGNOSIS — R55 Syncope and collapse: Secondary | ICD-10-CM | POA: Diagnosis not present

## 2016-10-07 NOTE — Progress Notes (Signed)
[facility] Summary Report / Loop Recorder 

## 2016-10-21 LAB — CUP PACEART REMOTE DEVICE CHECK
Date Time Interrogation Session: 20171206203704
Implantable Pulse Generator Implant Date: 20170110

## 2016-10-21 NOTE — Progress Notes (Signed)
[facility] summary report received. Battery status OK. Normal device function. No new symptom episodes, tachy episodes, brady, or pause episodes. No new AF episodes. Monthly summary reports and ROV/PRN 

## 2016-10-24 ENCOUNTER — Telehealth: Payer: Self-pay | Admitting: Cardiology

## 2016-10-24 NOTE — Telephone Encounter (Signed)
Spoke w/ pt about disconnected monitor. Pt is on a 6 month deployment. Called pt and lmovm for pt to return call.

## 2016-10-30 ENCOUNTER — Encounter: Payer: Self-pay | Admitting: Cardiology

## 2016-11-04 ENCOUNTER — Ambulatory Visit (INDEPENDENT_AMBULATORY_CARE_PROVIDER_SITE_OTHER): Admitting: *Deleted

## 2016-11-04 DIAGNOSIS — R55 Syncope and collapse: Secondary | ICD-10-CM

## 2016-11-04 NOTE — Telephone Encounter (Signed)
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 He will have his home monitor sent to him.

## 2016-11-05 NOTE — Progress Notes (Signed)
[facility] Summary Report / Loop Recorder 

## 2016-11-17 LAB — CUP PACEART REMOTE DEVICE CHECK
Date Time Interrogation Session: 20180105210809
Implantable Pulse Generator Implant Date: 20170110

## 2016-11-17 NOTE — Progress Notes (Signed)
[facility] summary report received. Battery status OK. Normal device function. No new symptom episodes, tachy episodes, brady, or pause episodes. No new AF episodes. Monthly summary reports and ROV/PRN 

## 2016-11-30 LAB — CUP PACEART REMOTE DEVICE CHECK
Implantable Pulse Generator Implant Date: 20170110
MDC IDC SESS DTM: 20180204210618

## 2016-11-30 NOTE — Progress Notes (Signed)
[facility] summary report received. Battery status OK. Normal device function. No new symptom episodes, tachy episodes, brady, or pause episodes. No new AF episodes. Monthly summary reports and ROV/PRN 

## 2016-12-03 ENCOUNTER — Encounter: Payer: PRIVATE HEALTH INSURANCE | Admitting: *Deleted

## 2017-01-02 ENCOUNTER — Ambulatory Visit (INDEPENDENT_AMBULATORY_CARE_PROVIDER_SITE_OTHER): Admitting: *Deleted

## 2017-01-02 DIAGNOSIS — R55 Syncope and collapse: Secondary | ICD-10-CM

## 2017-01-07 NOTE — Progress Notes (Signed)
[facility] Summary Report / Loop Recorder 

## 2017-01-10 LAB — CUP PACEART REMOTE DEVICE CHECK
Date Time Interrogation Session: 20180405220640
MDC IDC PG IMPLANT DT: 20170110

## 2017-02-03 ENCOUNTER — Encounter: Admitting: *Deleted

## 2017-03-03 ENCOUNTER — Ambulatory Visit (INDEPENDENT_AMBULATORY_CARE_PROVIDER_SITE_OTHER): Admitting: *Deleted

## 2017-03-03 DIAGNOSIS — R55 Syncope and collapse: Secondary | ICD-10-CM

## 2017-03-04 NOTE — Progress Notes (Signed)
[facility] Summary Report / Loop Recorder 

## 2017-03-05 ENCOUNTER — Encounter: Payer: Self-pay | Admitting: Cardiology

## 2017-03-05 LAB — CUP PACEART REMOTE DEVICE CHECK
Date Time Interrogation Session: 20180604233529
MDC IDC PG IMPLANT DT: 20170110

## 2017-04-03 ENCOUNTER — Ambulatory Visit (INDEPENDENT_AMBULATORY_CARE_PROVIDER_SITE_OTHER): Admitting: *Deleted

## 2017-04-03 DIAGNOSIS — R55 Syncope and collapse: Secondary | ICD-10-CM

## 2017-04-04 NOTE — Progress Notes (Signed)
[facility] Summary Report / Loop Recorder 

## 2017-04-07 LAB — CUP PACEART REMOTE DEVICE CHECK
Date Time Interrogation Session: 20180705000954
MDC IDC PG IMPLANT DT: 20170110

## 2017-05-02 ENCOUNTER — Ambulatory Visit (INDEPENDENT_AMBULATORY_CARE_PROVIDER_SITE_OTHER): Admitting: *Deleted

## 2017-05-02 DIAGNOSIS — R55 Syncope and collapse: Secondary | ICD-10-CM

## 2017-05-06 NOTE — Progress Notes (Signed)
[facility] Summary Report / Loop Recorder 

## 2017-05-13 LAB — CUP PACEART REMOTE DEVICE CHECK
Date Time Interrogation Session: 20180804004202
MDC IDC PG IMPLANT DT: 20170110

## 2017-05-22 ENCOUNTER — Ambulatory Visit: Admitting: Cardiovascular Disease

## 2017-05-26 IMAGING — MR MR CERVICAL SPINE W/O CM
4 of 5 series · 21 of 48 positions shown · non-contrast
Comparison: 06/01/2013

CLINICAL DATA: Left neck pain radiating into the left upper arm
with occasional numbness and tingling, ongoing for 3 years.

EXAM:
MRI CERVICAL SPINE WITHOUT CONTRAST
TECHNIQUE: Multiplanar, multisequence MR imaging of the cervical spine was
performed. No intravenous contrast was administered.

[Series 2: T2 · sagittal · 3.0mm · 0.41mm/px · 6 of 12 slices shown (1 of 3)]
[im 1/12]
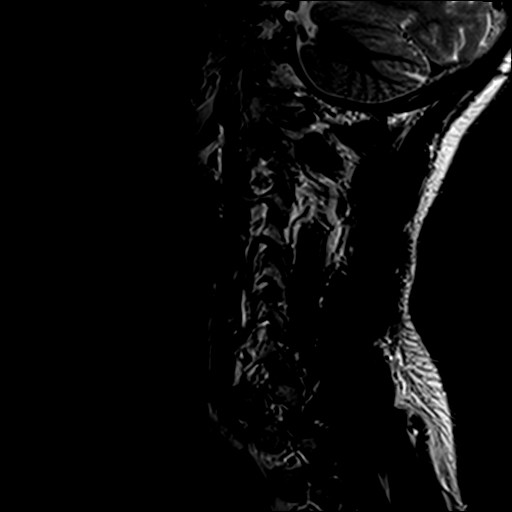
[im 3/12]
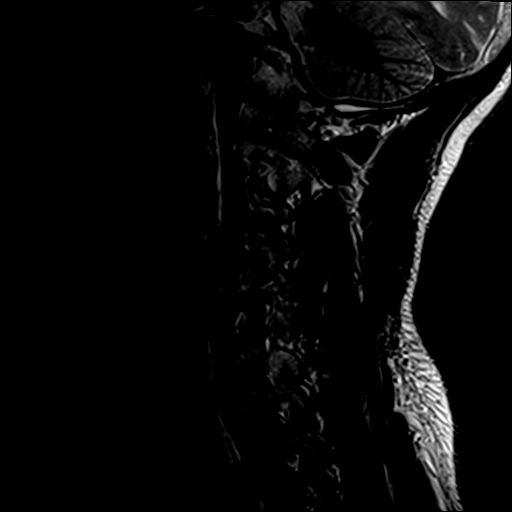
[im 5/12]
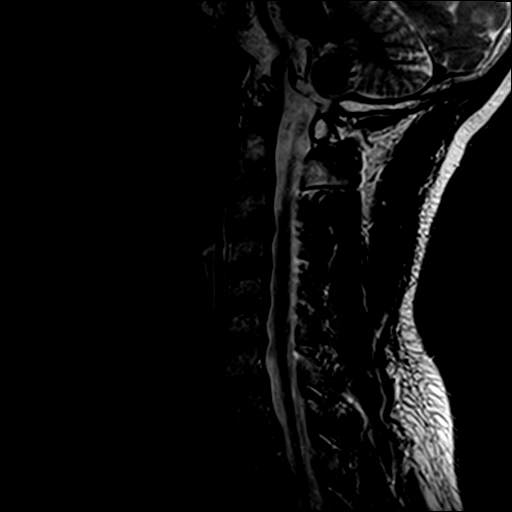
[im 7/12]
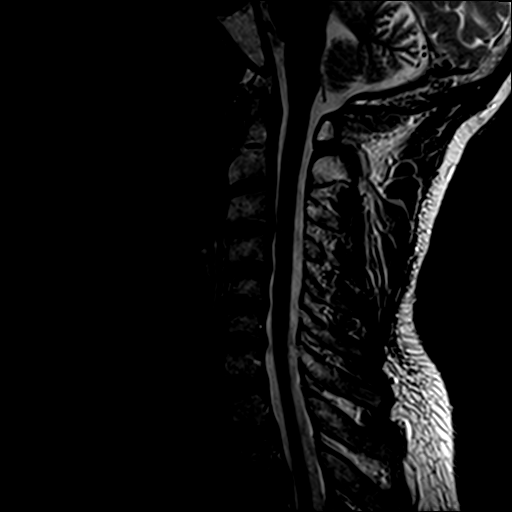
[im 9/12]
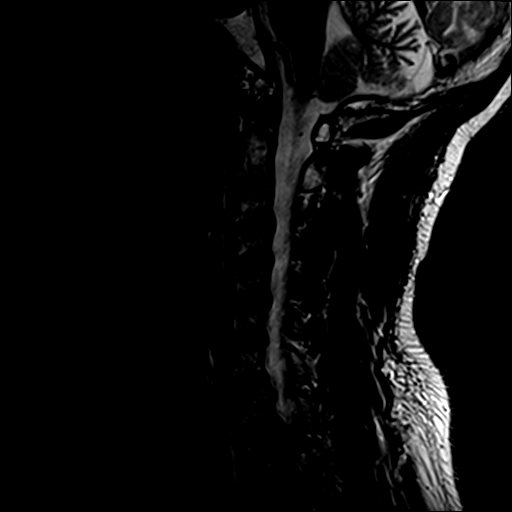
[im 12/12]
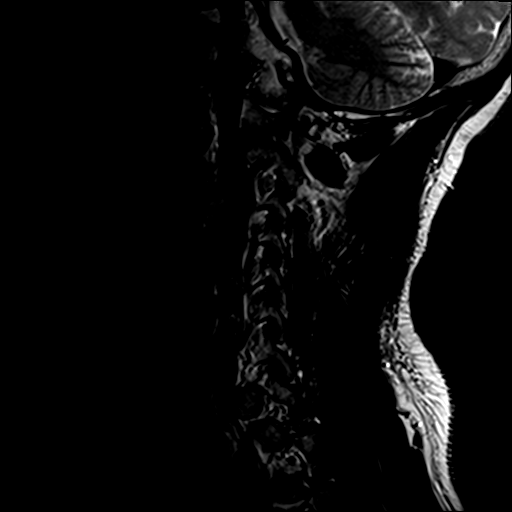

[Series 3: T1 · sagittal · 3.0mm · 0.41mm/px · 3 of 12 slices shown]
[im 2/12]
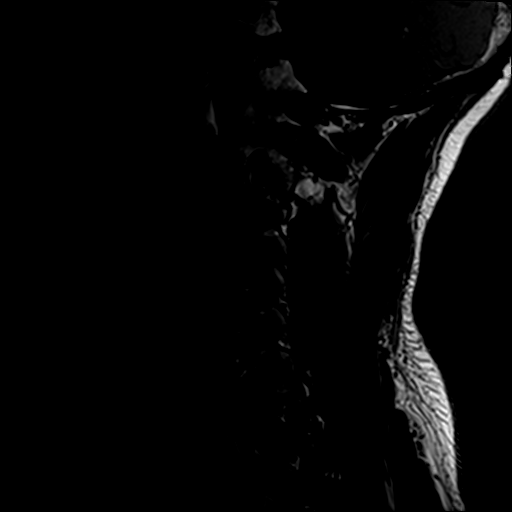
[im 6/12]
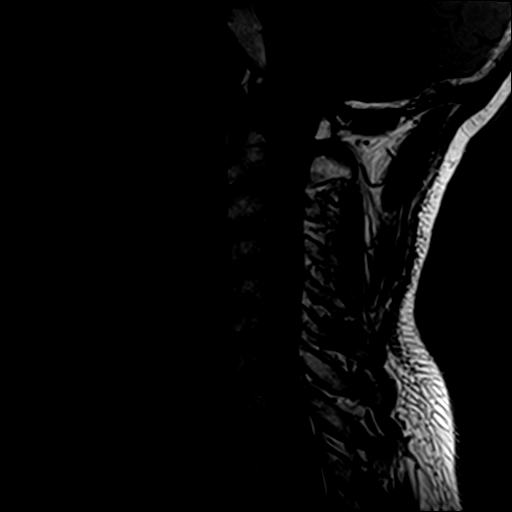
[im 10/12]
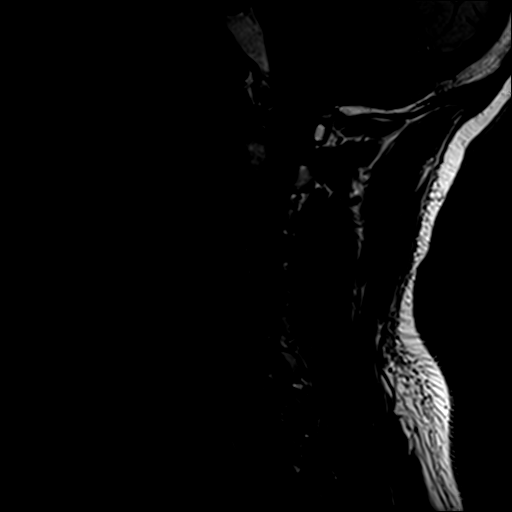

[Series 5: T2 · axial · 3.0mm · 0.39mm/px · z∈[-17,+76]mm · 8 of 26 slices shown (2 of 3)]
[im 1/26]
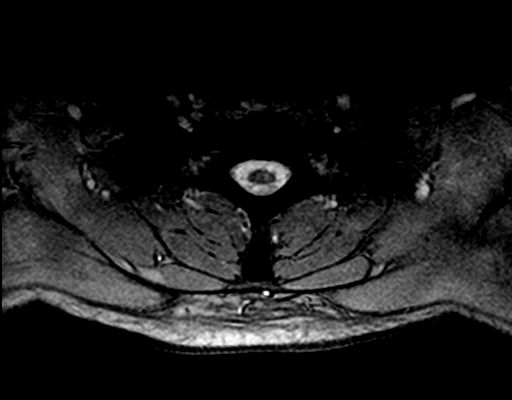
[im 4/26]
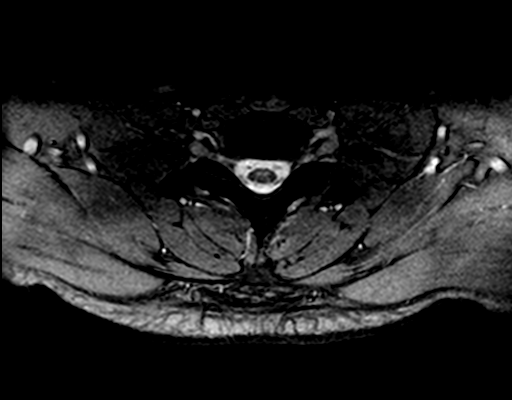
[im 8/26]
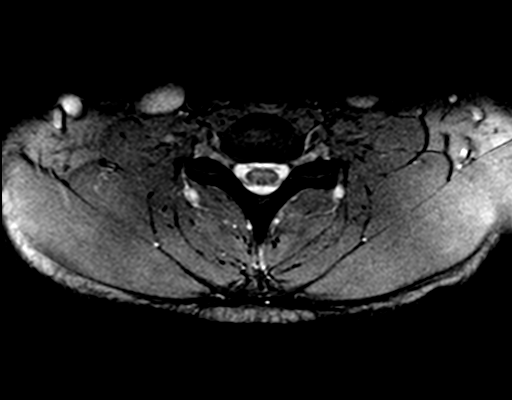
[im 12/26]
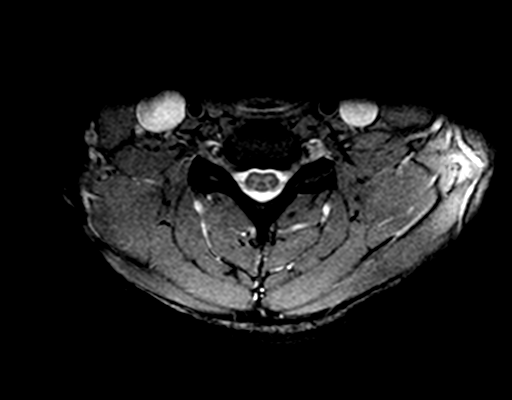
[im 14/26]
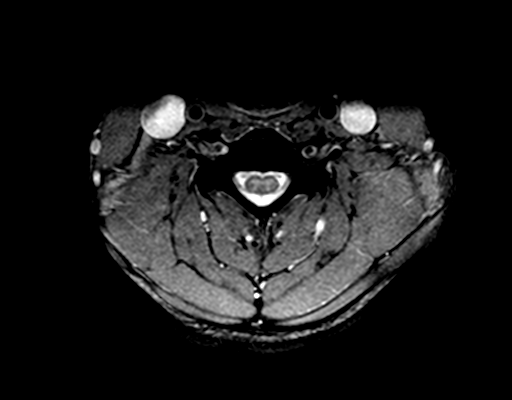
[im 18/26]
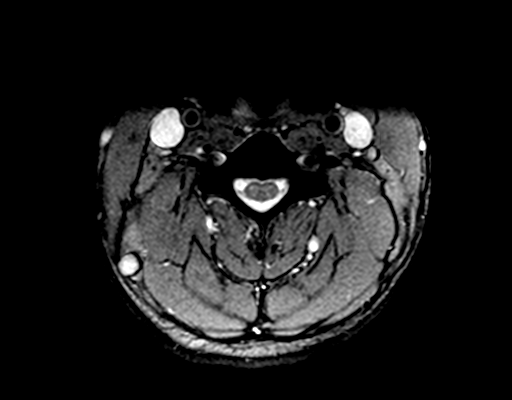
[im 22/26]
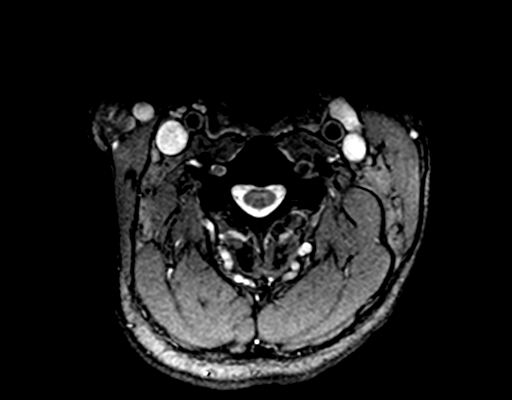
[im 26/26]
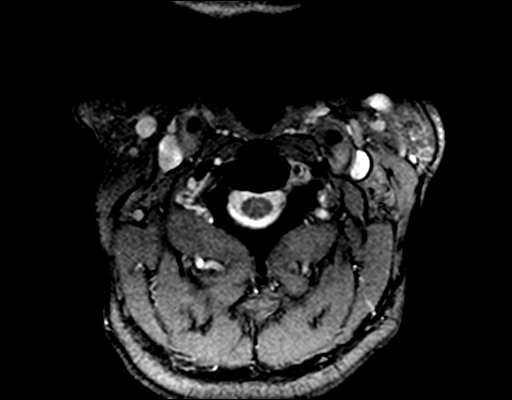

[Series 6: T2 · axial · 3.0mm · 0.39mm/px · z∈[-18,+60]mm · 4 of 26 slices shown (3 of 3)]
[im 1/26]
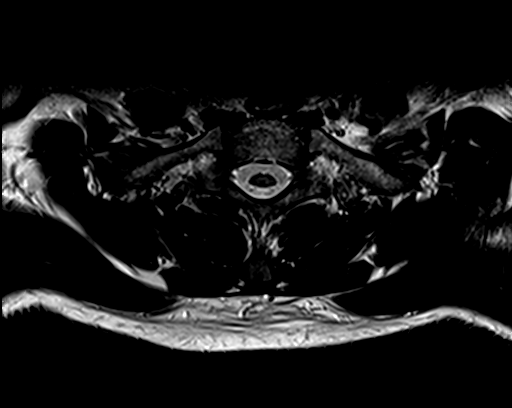
[im 4/26]
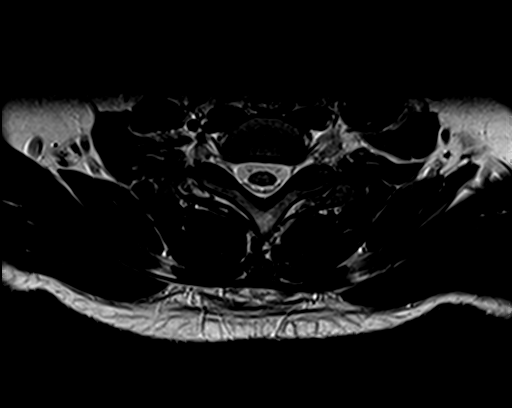
[im 14/26]
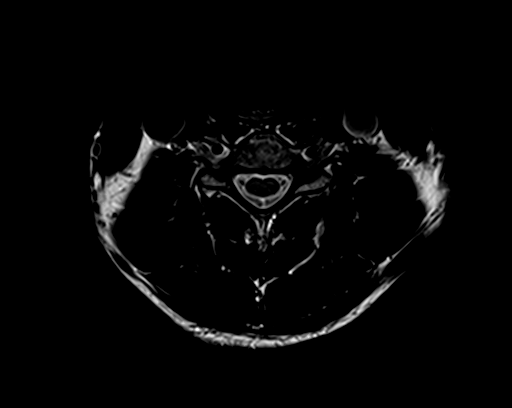
[im 22/26]
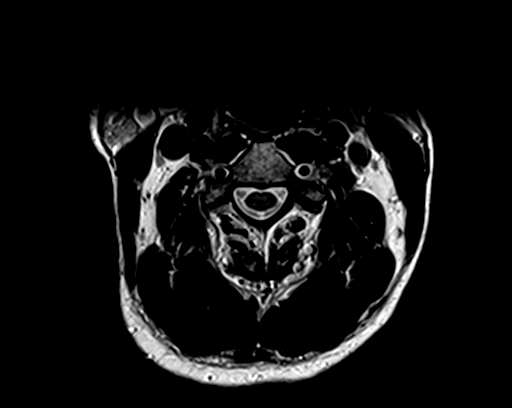

[21 of 48 positions shown; findings below may reference images not displayed]

FINDINGS: 8 mm right posterior cervical triangle lymph node, image 9 series 6,
likely incidental/reactive.

The craniocervical junction appears unremarkable. Expected flow
signal in the vertebral arteries.

No significant abnormal spinal cord signal is observed. No vertebral
subluxation is observed. No significant vertebral marrow edema is
identified. Additional findings at individual levels are as follows:

C2-3:  Unremarkable.

C3-4: Moderate right and mild left foraminal stenosis due to disc
bulge and uncinate spurring, slightly worsened from prior.

C4-5: Moderate right and mild left foraminal stenosis due to disc
bulge and uncinate spurring.

C5-6: Moderate right and mild to moderate left foraminal stenosis
due to disc bulge and uncinate spurring.

C6-7: Moderate bilateral foraminal stenosis due to disc bulge and
uncinate spurring.

C7-T1: Mild left foraminal stenosis due to facet spurring and
uncinate spurring.
IMPRESSION: 1. Mildly progressive cervical spondylosis and degenerative disc
disease, with moderate foraminal impingement at C3-4, C4-5, C5-6,
and C6-7; and mild impingement at C7-T1, as detailed above.

## 2017-06-03 ENCOUNTER — Ambulatory Visit (INDEPENDENT_AMBULATORY_CARE_PROVIDER_SITE_OTHER): Admitting: *Deleted

## 2017-06-03 DIAGNOSIS — R55 Syncope and collapse: Secondary | ICD-10-CM

## 2017-06-05 NOTE — Progress Notes (Signed)
[facility] Summary Report / Loop Recorder 

## 2017-06-08 LAB — CUP PACEART REMOTE DEVICE CHECK
Implantable Pulse Generator Implant Date: 20170110
MDC IDC SESS DTM: 20180903003849

## 2017-07-01 ENCOUNTER — Ambulatory Visit (INDEPENDENT_AMBULATORY_CARE_PROVIDER_SITE_OTHER): Admitting: *Deleted

## 2017-07-01 DIAGNOSIS — R55 Syncope and collapse: Secondary | ICD-10-CM

## 2017-07-02 NOTE — Progress Notes (Signed)
[facility] Summary Report / Loop Recorder 

## 2017-07-03 LAB — CUP PACEART REMOTE DEVICE CHECK
Implantable Pulse Generator Implant Date: 20170110
MDC IDC SESS DTM: 20181003020947

## 2017-07-28 ENCOUNTER — Ambulatory Visit: Admitting: Cardiovascular Disease

## 2017-07-29 ENCOUNTER — Encounter: Payer: Self-pay | Admitting: *Deleted

## 2017-07-31 ENCOUNTER — Encounter

## 2017-08-01 ENCOUNTER — Ambulatory Visit (INDEPENDENT_AMBULATORY_CARE_PROVIDER_SITE_OTHER): Admitting: *Deleted

## 2017-08-01 DIAGNOSIS — R55 Syncope and collapse: Secondary | ICD-10-CM | POA: Diagnosis not present

## 2017-08-01 NOTE — Progress Notes (Signed)
[facility] Summary Report / Loop Recorder 

## 2017-08-04 LAB — CUP PACEART REMOTE DEVICE CHECK
Date Time Interrogation Session: 20181102023942
MDC IDC PG IMPLANT DT: 20170110

## 2017-09-01 ENCOUNTER — Ambulatory Visit (INDEPENDENT_AMBULATORY_CARE_PROVIDER_SITE_OTHER): Admitting: *Deleted

## 2017-09-01 DIAGNOSIS — R55 Syncope and collapse: Secondary | ICD-10-CM

## 2017-09-01 NOTE — Progress Notes (Signed)
[facility] Summary Report / Loop Recorder 

## 2017-09-09 LAB — CUP PACEART REMOTE DEVICE CHECK
Date Time Interrogation Session: 20181202024102
MDC IDC PG IMPLANT DT: 20170110

## 2017-09-29 ENCOUNTER — Ambulatory Visit (INDEPENDENT_AMBULATORY_CARE_PROVIDER_SITE_OTHER): Admitting: *Deleted

## 2017-09-29 DIAGNOSIS — R55 Syncope and collapse: Secondary | ICD-10-CM

## 2017-10-01 NOTE — Progress Notes (Signed)
[facility] Summary Report / Loop Recorder 

## 2017-10-09 LAB — CUP PACEART REMOTE DEVICE CHECK
Date Time Interrogation Session: 20190101030847
Implantable Pulse Generator Implant Date: 20170110

## 2017-10-29 ENCOUNTER — Ambulatory Visit (INDEPENDENT_AMBULATORY_CARE_PROVIDER_SITE_OTHER): Admitting: *Deleted

## 2017-10-29 DIAGNOSIS — R55 Syncope and collapse: Secondary | ICD-10-CM

## 2017-10-30 NOTE — Progress Notes (Signed)
[facility] Summary Report / Loop Recorder 

## 2017-11-11 LAB — CUP PACEART REMOTE DEVICE CHECK
Date Time Interrogation Session: 20190131030841
MDC IDC PG IMPLANT DT: 20170110

## 2017-12-01 ENCOUNTER — Ambulatory Visit (INDEPENDENT_AMBULATORY_CARE_PROVIDER_SITE_OTHER): Admitting: *Deleted

## 2017-12-01 DIAGNOSIS — R55 Syncope and collapse: Secondary | ICD-10-CM

## 2017-12-02 NOTE — Progress Notes (Signed)
[facility] Summary Report / Loop Recorder 

## 2017-12-16 ENCOUNTER — Ambulatory Visit (INDEPENDENT_AMBULATORY_CARE_PROVIDER_SITE_OTHER): Admitting: Cardiovascular Disease

## 2017-12-16 ENCOUNTER — Encounter: Payer: Self-pay | Admitting: Cardiovascular Disease

## 2017-12-16 VITALS — BP 140/90 | HR 67 | Ht 71.0 in | Wt 211.0 lb

## 2017-12-16 DIAGNOSIS — Z95818 Presence of other cardiac implants and grafts: Secondary | ICD-10-CM | POA: Diagnosis not present

## 2017-12-16 DIAGNOSIS — R030 Elevated blood-pressure reading, without diagnosis of hypertension: Secondary | ICD-10-CM

## 2017-12-16 DIAGNOSIS — R002 Palpitations: Secondary | ICD-10-CM | POA: Diagnosis not present

## 2017-12-16 NOTE — Patient Instructions (Signed)

## 2017-12-16 NOTE — Progress Notes (Signed)
Patient ID: Juan Frank, male   DOB: 02/26/1966, 52 y.o.   MRN: [id number]    Cardiology Office Note    Date:  12/16/2017   ID:  Juan Frank, DOB [birth year], MRN [id number]  PCP:  Shirline Frees, MD  Cardiologist:   Sanda Klein, MD   No chief complaint on file.   History of Present Illness:  Juan Frank is a 52 y.o. male with palpitations and near-syncope, here for loop recorder check.  From a cardiac symptom point of view he is actually had a pretty good year.  The spells of palpitations followed by extreme weakness have dramatically reduced in frequency.  He thinks it is only happened once in the last 6 months, an episode in early February when he went to urgent care in [facility].  His ECG was normal with a heart rate of 79.  Labs including a point-of-care troponin were normal  He has a variety of joint related aches and pains and has been taking nonsteroidal anti-inflammatory drugs.  Is gained some weight.  His BMI is now in the borderline obese range, but he is extremely athletic and muscular and I suspected overestimates his true weight gain.  His blood pressure is higher than it was in the past.  He does have a family history of hypertension diabetes and I believe he has been able to keep both of those at [place] with a very active lifestyle with intense physical exercise several times a week.  His activity level has diminished over the last several months following the unexpected and very unfortunate diagnosis of widespread malignancy in his 62 year old daughter.  When the palpitations occur they generally build up gradually and resolved gradually.  If he checks his heart rate is usually in the 80-100 range which is not particularly fast but which is unusually fast for him.  His loop recorder has never shown any evidence of true arrhythmia.  On the one episode that he activated the recorder the rhythm was normal sinus.  Eulises had an extensive workup in 2015 with treadmill  stress testing, echocardiography and an event monitor, all results unrevealing.    Past Medical History:  Diagnosis Date  . Pre-syncope 02/19/2012    Past Surgical History:  Procedure Laterality Date  . APPENDECTOMY    . ELECTROPHYSIOLOGIC STUDY N/A 10/10/2015   Procedure: Tilt Table Study;  Surgeon: Sanda Klein, MD;  Location: [facility];  Service: Cardiovascular;  Laterality: N/A;  . EP IMPLANTABLE DEVICE N/A 10/10/2015   Procedure: Loop Recorder Insertion;  Surgeon: Sanda Klein, MD;  Location: [facility];  Service: Cardiovascular;  Laterality: N/A;  . TRANSTHORACIC ECHOCARDIOGRAM  02/27/2012   EF =>55%. ATRIAL SEPTUM IS ANEURYSMAL. [place].  . VASECTOMY      Current Medications: Outpatient Medications Prior to Visit  Medication Sig Dispense Refill  . cyclobenzaprine (FLEXERIL) 10 MG tablet Take 10 mg by mouth 3 (three) times daily as needed for muscle spasms.    Marland Kitchen ibuprofen (ADVIL,MOTRIN) 200 MG tablet Take 600 mg by mouth every 6 (six) hours as needed for moderate pain.    . naproxen sodium (ANAPROX) 220 MG tablet Take 440 mg by mouth 2 (two) times daily as needed.      No facility-administered medications prior to visit.      Allergies:   Penicillins   Social History   Socioeconomic History  . Marital status: Single    Spouse name: None  . Number  of children: None  . Years of education: None  . Highest education level: None  Social Needs  . Financial resource strain: None  . Food insecurity - worry: None  . Food insecurity - inability: None  . Transportation needs - medical: None  . Transportation needs - non-medical: None  Occupational History  . None  Tobacco Use  . Smoking status: Never Smoker  . Smokeless tobacco: Never Used  Substance and Sexual Activity  . Alcohol use: No  . Drug use: No  . Sexual activity: None  Other Topics Concern  . None  Social History Narrative  . None     Family History:  The  patient's family history includes Diabetes in his father, mother, and sister; Sjogren's syndrome in his sister; Stroke in his maternal grandmother.   ROS:   Please see the history of present illness.    ROS All other systems reviewed and are negative.   PHYSICAL EXAM:   VS:  BP 140/90   Pulse 67   Ht 5\' 11"  (1.803 m)   Wt 211 lb (95.7 kg)   BMI 29.43 kg/m     General: Alert, oriented x3, no distress, looks very muscular and fit, but has clearly gained some weight as well. Head: no evidence of trauma, PERRL, EOMI, no exophtalmos or lid lag, no myxedema, no xanthelasma; normal ears, nose and oropharynx Neck: normal jugular venous pulsations and no hepatojugular reflux; brisk carotid pulses without delay and no carotid bruits Chest: clear to auscultation, no signs of consolidation by percussion or palpation, normal fremitus, symmetrical and full respiratory excursions Cardiovascular: normal position and quality of the apical impulse, regular rhythm, normal first and second heart sounds, no murmurs, rubs or gallops Abdomen: no tenderness or distention, no masses by palpation, no abnormal pulsatility or arterial bruits, normal bowel sounds, no hepatosplenomegaly Extremities: no clubbing, cyanosis or edema; 2+ radial, ulnar and brachial pulses bilaterally; 2+ right femoral, posterior tibial and dorsalis pedis pulses; 2+ left femoral, posterior tibial and dorsalis pedis pulses; no subclavian or femoral bruits Neurological: grossly nonfocal Psych: Normal mood and affect   Wt Readings from Last 3 Encounters:  12/16/17 211 lb (95.7 kg)  01/02/16 203 lb (92.1 kg)  08/30/15 199 lb 4.8 oz (90.4 kg)      Studies/Labs Reviewed:   EKG:  EKG is ordered today.  The ekg ordered today demonstrates normal sinus rhythm, normal tracing  Recent Labs: No results found for requested labs within last 8760 hours.   Lipid Panel No results found for: CHOL, TRIG, HDL, CHOLHDL, VLDL, LDLCALC,  LDLDIRECT    ASSESSMENT:    1. Palpitations   2. Status post placement of implantable loop recorder   3. Elevated blood pressure reading      PLAN:  In order of problems listed above:  1. No new events in the last several months, except for the one episode in February 2. Continue loop recorder monthly, yearly office visit 3. Go back to regular exercise, avoid additional weight gain, avoid excessive use of nonsteroidal anti-inflammatory drugs which will increase his blood pressure    Medication Adjustments/Labs and Tests Ordered: Current medicines are reviewed at length with the patient today.  Concerns regarding medicines are outlined above.  Medication changes, Labs and Tests ordered today are listed in the Patient Instructions below. Patient Instructions  Medication Instructions:  Your physician recommends that you continue on your current medications as directed. Please refer to the Current Medication list given to you today.  Labwork: NONE  Testing/Procedures: NONE  Follow-Up: Your physician wants you to follow-up in: 1 [place] will receive a reminder letter in the mail two months in advance. If you don't receive a letter, please call our office to schedule the follow-up appointment.  If you need a refill on your cardiac medications before your next appointment, please call your pharmacy.     Signed, Sanda Klein, MD  12/16/2017 7:26 PM    [facility], [place], [place]  [address] Phone: [phone]; Fax: [phone]

## 2018-01-05 ENCOUNTER — Ambulatory Visit (INDEPENDENT_AMBULATORY_CARE_PROVIDER_SITE_OTHER): Admitting: *Deleted

## 2018-01-05 DIAGNOSIS — R55 Syncope and collapse: Secondary | ICD-10-CM | POA: Diagnosis not present

## 2018-01-05 LAB — CUP PACEART REMOTE DEVICE CHECK
Date Time Interrogation Session: 20190305033600
Implantable Pulse Generator Implant Date: 20170110

## 2018-01-06 NOTE — Progress Notes (Signed)
[facility] Summary Report / Loop Recorder 

## 2018-02-04 ENCOUNTER — Telehealth: Payer: Self-pay | Admitting: Cardiology

## 2018-02-04 NOTE — Telephone Encounter (Signed)
Spoke w/ pt wife and requested that he send a manual transmission b/c his home monitor has not updated in at least 14 days.   

## 2018-02-06 ENCOUNTER — Ambulatory Visit (INDEPENDENT_AMBULATORY_CARE_PROVIDER_SITE_OTHER): Admitting: *Deleted

## 2018-02-06 DIAGNOSIS — R55 Syncope and collapse: Secondary | ICD-10-CM

## 2018-02-06 LAB — CUP PACEART REMOTE DEVICE CHECK
Date Time Interrogation Session: 20190407083946
MDC IDC PG IMPLANT DT: 20170110

## 2018-02-09 NOTE — Progress Notes (Signed)
[facility] Summary Report / Loop Recorder 

## 2018-03-03 LAB — CUP PACEART REMOTE DEVICE CHECK
Implantable Pulse Generator Implant Date: 20170110
MDC IDC SESS DTM: 20190510084130

## 2018-03-11 ENCOUNTER — Ambulatory Visit (INDEPENDENT_AMBULATORY_CARE_PROVIDER_SITE_OTHER): Admitting: *Deleted

## 2018-03-11 DIAGNOSIS — R55 Syncope and collapse: Secondary | ICD-10-CM

## 2018-03-11 NOTE — Progress Notes (Signed)
[facility] Summary Report / Loop Recorder 

## 2018-04-13 ENCOUNTER — Ambulatory Visit (INDEPENDENT_AMBULATORY_CARE_PROVIDER_SITE_OTHER): Admitting: *Deleted

## 2018-04-13 DIAGNOSIS — R55 Syncope and collapse: Secondary | ICD-10-CM | POA: Diagnosis not present

## 2018-04-14 NOTE — Progress Notes (Signed)
[facility] Summary Report / Loop Recorder 

## 2018-04-17 LAB — CUP PACEART REMOTE DEVICE CHECK
Date Time Interrogation Session: 20190612093714
Implantable Pulse Generator Implant Date: 20170110

## 2018-05-18 ENCOUNTER — Ambulatory Visit (INDEPENDENT_AMBULATORY_CARE_PROVIDER_SITE_OTHER): Admitting: *Deleted

## 2018-05-18 DIAGNOSIS — R55 Syncope and collapse: Secondary | ICD-10-CM

## 2018-05-18 DIAGNOSIS — R002 Palpitations: Secondary | ICD-10-CM

## 2018-05-19 NOTE — Progress Notes (Signed)
[facility] Summary Report / Loop Recorder 

## 2018-06-01 LAB — CUP PACEART REMOTE DEVICE CHECK
Implantable Pulse Generator Implant Date: 20170110
MDC IDC SESS DTM: 20190715121047

## 2018-06-18 ENCOUNTER — Ambulatory Visit (INDEPENDENT_AMBULATORY_CARE_PROVIDER_SITE_OTHER): Admitting: *Deleted

## 2018-06-18 DIAGNOSIS — R55 Syncope and collapse: Secondary | ICD-10-CM | POA: Diagnosis not present

## 2018-06-19 NOTE — Progress Notes (Signed)
[facility] Summary Report / Loop Recorder 

## 2018-06-22 LAB — CUP PACEART REMOTE DEVICE CHECK
Date Time Interrogation Session: 20190817123721
Implantable Pulse Generator Implant Date: 20170110

## 2018-06-29 LAB — CUP PACEART REMOTE DEVICE CHECK
Implantable Pulse Generator Implant Date: 20170110
MDC IDC SESS DTM: 20190919141007

## 2018-07-21 ENCOUNTER — Ambulatory Visit (INDEPENDENT_AMBULATORY_CARE_PROVIDER_SITE_OTHER): Admitting: *Deleted

## 2018-07-21 DIAGNOSIS — R55 Syncope and collapse: Secondary | ICD-10-CM | POA: Diagnosis not present

## 2018-07-21 NOTE — Progress Notes (Signed)
[facility] Summary Report / Loop Recorder 

## 2018-08-13 LAB — CUP PACEART REMOTE DEVICE CHECK
Implantable Pulse Generator Implant Date: 20170110
MDC IDC SESS DTM: 20191022143756

## 2018-08-21 ENCOUNTER — Telehealth: Payer: Self-pay

## 2018-08-21 NOTE — Telephone Encounter (Signed)
LMOVM requesting that pt send manual transmission b/c home monitor has not updated in at least 14 days.    

## 2018-08-24 ENCOUNTER — Ambulatory Visit (INDEPENDENT_AMBULATORY_CARE_PROVIDER_SITE_OTHER)

## 2018-08-24 DIAGNOSIS — R55 Syncope and collapse: Secondary | ICD-10-CM | POA: Diagnosis not present

## 2018-08-24 NOTE — Progress Notes (Signed)
[facility] Summary Report / Loop Recorder 

## 2018-09-25 ENCOUNTER — Ambulatory Visit (INDEPENDENT_AMBULATORY_CARE_PROVIDER_SITE_OTHER)

## 2018-09-25 DIAGNOSIS — R55 Syncope and collapse: Secondary | ICD-10-CM | POA: Diagnosis not present

## 2018-09-27 LAB — CUP PACEART REMOTE DEVICE CHECK
Date Time Interrogation Session: 20191227153955
Implantable Pulse Generator Implant Date: 20170110

## 2018-09-28 NOTE — Progress Notes (Signed)
[facility] Summary Report / Loop Recorder 

## 2018-10-11 LAB — CUP PACEART REMOTE DEVICE CHECK
Date Time Interrogation Session: 20191124153757
Implantable Pulse Generator Implant Date: 20170110

## 2018-10-28 ENCOUNTER — Ambulatory Visit (INDEPENDENT_AMBULATORY_CARE_PROVIDER_SITE_OTHER): Payer: Self-pay

## 2018-10-28 DIAGNOSIS — R55 Syncope and collapse: Secondary | ICD-10-CM

## 2018-10-29 NOTE — Progress Notes (Signed)
[facility] Summary Report / Loop Recorder 

## 2018-10-30 LAB — CUP PACEART REMOTE DEVICE CHECK
Date Time Interrogation Session: 20200129164043
Implantable Pulse Generator Implant Date: 20170110

## 2018-11-30 ENCOUNTER — Ambulatory Visit (INDEPENDENT_AMBULATORY_CARE_PROVIDER_SITE_OTHER): Payer: Self-pay | Admitting: *Deleted

## 2018-11-30 DIAGNOSIS — R55 Syncope and collapse: Secondary | ICD-10-CM

## 2018-11-30 LAB — CUP PACEART REMOTE DEVICE CHECK
Date Time Interrogation Session: 20200302151100
Implantable Pulse Generator Implant Date: 20170110

## 2018-12-08 NOTE — Progress Notes (Signed)
[facility] Summary Report / Loop Recorder 

## 2018-12-15 ENCOUNTER — Telehealth: Payer: Self-pay | Admitting: Cardiovascular Disease

## 2018-12-15 NOTE — Telephone Encounter (Signed)
Please advise on if this should be ordered, and if they are completing these types of procedures now.   Advised I would route to MD and to nurse.  Thank you!

## 2018-12-15 NOTE — Telephone Encounter (Signed)
New message   Patient wants to set up an appt to have loop recorder removed. Please call patient to discuss.

## 2018-12-15 NOTE — Telephone Encounter (Signed)
Called him and told him we are not scheduling any elective procedures right now. Will touch base with him after the current restrictions are lifetd, no sooner than late April.  Please discontinue the Linq downloads. They are not necessary any longer.  MCr

## 2018-12-16 NOTE — Telephone Encounter (Signed)
Noted. Thanks.

## 2019-07-13 ENCOUNTER — Telehealth: Payer: Self-pay | Admitting: *Deleted

## 2019-07-13 NOTE — Telephone Encounter (Signed)
Left a message for the patient to call back to see if he could come in on 10/19 at 2:20 for the loop recorder extraction with Dr. Sallyanne Kuster (office visit)

## 2019-07-19 ENCOUNTER — Encounter: Payer: Self-pay | Admitting: Cardiovascular Disease

## 2019-07-19 ENCOUNTER — Other Ambulatory Visit: Payer: Self-pay

## 2019-07-19 ENCOUNTER — Ambulatory Visit: Payer: BC Managed Care – PPO | Admitting: Cardiovascular Disease

## 2019-07-19 DIAGNOSIS — Z4509 Encounter for adjustment and management of other cardiac device: Secondary | ICD-10-CM

## 2019-07-19 NOTE — Progress Notes (Signed)
Patient ID: Juan Frank, male   DOB: Jan 29, 1966, 53 y.o.   MRN: [id number]    Cardiology Office Note    Date:  07/19/2019   ID:  Juan Frank, DOB 10/02/1965, MRN [id number]  PCP:  Shirline Frees, MD  Cardiologist:   Sanda Klein, MD   No chief complaint on file.   History of Present Illness:  Juan Frank is a 53 y.o. male with palpitations and near-syncope, here for loop recorder extraction (device implanted 2017, has reached end of service). No significant arrhythmia has been detected.  The patient specifically denies any chest pain at rest exertion, dyspnea at rest or with exertion, orthopnea, paroxysmal nocturnal dyspnea, syncope, palpitations, focal neurological deficits, intermittent claudication, lower extremity edema, unexplained weight gain, cough, hemoptysis or wheezing.  His loop recorder has never shown any evidence of true arrhythmia.  On the one episode that he activated the recorder the rhythm was normal sinus.  Juan Frank had an extensive workup in 2015 with treadmill stress testing, echocardiography and an event monitor, all results unrevealing.    Past Medical History:  Diagnosis Date  . Pre-syncope 02/19/2012    Past Surgical History:  Procedure Laterality Date  . APPENDECTOMY    . ELECTROPHYSIOLOGIC STUDY N/A 10/10/2015   Procedure: Tilt Table Study;  Surgeon: Sanda Klein, MD;  Location: [facility];  Service: Cardiovascular;  Laterality: N/A;  . EP IMPLANTABLE DEVICE N/A 10/10/2015   Procedure: Loop Recorder Insertion;  Surgeon: Sanda Klein, MD;  Location: [facility];  Service: Cardiovascular;  Laterality: N/A;  . TRANSTHORACIC ECHOCARDIOGRAM  02/27/2012   EF =>55%. ATRIAL SEPTUM IS ANEURYSMAL. [place].  . VASECTOMY      Current Medications: Outpatient Medications Prior to Visit  Medication Sig Dispense Refill  . cyclobenzaprine (FLEXERIL) 10 MG tablet Take 10 mg by mouth 3 (three) times daily as  needed for muscle spasms.    Marland Kitchen ibuprofen (ADVIL,MOTRIN) 200 MG tablet Take 600 mg by mouth every 6 (six) hours as needed for moderate pain.    . naproxen sodium (ANAPROX) 220 MG tablet Take 440 mg by mouth 2 (two) times daily as needed.      No facility-administered medications prior to visit.      Allergies:   Penicillins   Social History   Socioeconomic History  . Marital status: Single    Spouse name: Not on file  . Number of children: Not on file  . Years of education: Not on file  . Highest education level: Not on file  Occupational History  . Not on file  Social Needs  . Financial resource strain: Not on file  . Food insecurity    Worry: Not on file    Inability: Not on file  . Transportation needs    Medical: Not on file    Non-medical: Not on file  Tobacco Use  . Smoking status: Never Smoker  . Smokeless tobacco: Never Used  Substance and Sexual Activity  . Alcohol use: No  . Drug use: No  . Sexual activity: Not on file  Lifestyle  . Physical activity    Days per week: Not on file    Minutes per session: Not on file  . Stress: Not on file  Relationships  . Social Herbalist on phone: Not on file    Gets together: Not on file    Attends religious service: Not on file    Active member of club or  organization: Not on file    Attends meetings of clubs or organizations: Not on file    Relationship status: Not on file  Other Topics Concern  . Not on file  Social History Narrative  . Not on file     Family History:  The patient's family history includes Diabetes in his father, mother, and sister; Sjogren's syndrome in his sister; Stroke in his maternal grandmother.   ROS:   Please see the history of present illness.    ROS All other systems reviewed and are negative.   PHYSICAL EXAM:   VS:  There were no vitals taken for this visit.     General: Alert, oriented x3, no distress, overweight, but muscular/fit. Head: no evidence of trauma,  PERRL, EOMI, no exophtalmos or lid lag, no myxedema, no xanthelasma; normal ears, nose and oropharynx Neck: normal jugular venous pulsations and no hepatojugular reflux; brisk carotid pulses without delay and no carotid bruits Chest: clear to auscultation, no signs of consolidation by percussion or palpation, normal fremitus, symmetrical and full respiratory excursions Cardiovascular: normal position and quality of the apical impulse, regular rhythm, normal first and second heart sounds, no murmurs, rubs or gallops Abdomen: no tenderness or distention, no masses by palpation, no abnormal pulsatility or arterial bruits, normal bowel sounds, no hepatosplenomegaly Extremities: no clubbing, cyanosis or edema; 2+ radial, ulnar and brachial pulses bilaterally; 2+ right femoral, posterior tibial and dorsalis pedis pulses; 2+ left femoral, posterior tibial and dorsalis pedis pulses; no subclavian or femoral bruits Neurological: grossly nonfocal Psych: Normal mood and affect    Wt Readings from Last 3 Encounters:  12/16/17 211 lb (95.7 kg)  01/02/16 203 lb (92.1 kg)  08/30/15 199 lb 4.8 oz (90.4 kg)      Studies/Labs Reviewed:   EKG:  EKG is ordered today.  The ekg ordered today demonstrates normal sinus rhythm, normal tracing  Recent Labs: No results found for requested labs within last 8760 hours.   Lipid Panel No results found for: CHOL, TRIG, HDL, CHOLHDL, VLDL, LDLCALC, LDLDIRECT    ASSESSMENT:    1. Encounter for loop recorder at end of battery life      PLAN:  In order of problems listed above:  This procedure has been fully reviewed with the patient and written informed consent has been obtained. Time out performed confirming correct patient and procedure. After the device was performed, wound care instructions were discussed and a virtual wound check visit was scheduled.    Medication Adjustments/Labs and Tests Ordered: Current medicines are reviewed at length with the  patient today.  Concerns regarding medicines are outlined above.  Medication changes, Labs and Tests ordered today are listed in the Patient Instructions below. There are no Patient Instructions on file for this visit.   Signed, Sanda Klein, MD  07/19/2019 3:15 PM    [address], [place], [place]  [address] Phone: [phone]; Fax: [phone]

## 2019-07-19 NOTE — Progress Notes (Signed)
PROCEDURE NOTE:    LOOP RECORDER EXPLANTATION   Procedure report  Procedure performed:  Loop recorder explantation   Reason for procedure:  1. History of syncope/near-syncope 2. Device at end of service Procedure performed by:  Yesenia Locurto, MD  Complications:  None  Estimated blood loss:  <5 mL  Medications administered during procedure:  Lidocaine 1% with 1/10,000 epinephrine 10 mL locally  Procedure details:  After the risks and benefits of the procedure were discussed the patient provided informed consent. The patient was prepped and draped in usual sterile fashion. Local anesthesia was administered to an area 2 cm to the left of the sternum in the 4th intercostal space. A cutaneous incision was made using the disposable scalpel. The device was etxracted using a forceps. Local pressure was held to ensure hemostasis.  The incision was closed with SteriStrips and a sterile dressing was applied.   Sande Pickert, MD, FACC [facility] [phone] office [phone] pager 07/19/2019 2:35 PM    

## 2019-07-19 NOTE — Patient Instructions (Signed)
Follow up: You will need a virtual appointment for a wound check with our device clinic in 10-14 days.   Implantable Loop Recorder Extraction, Care After This sheet gives you information about how to care for yourself after your procedure. Your health care provider may also give you more specific instructions. If you have problems or questions, contact your health care provider. What can I expect after the procedure? After the procedure, it is common to have:  Soreness or discomfort near the incision.  Some swelling or bruising near the incision. Follow these instructions at home: Incision care   Follow instructions from your health care provider about how to take care of your incision. Make sure you: ? Wash your hands with soap and water before you change your bandage (dressing). If soap and water are not available, use hand sanitizer. ? Change your dressing as told by your health care provider. ? Keep your dressing dry. ? Leave stitches (sutures), skin glue, or adhesive strips in place. These skin closures may need to stay in place for 2 weeks or longer. If adhesive strip edges start to loosen and curl up, you may trim the loose edges. Do not remove adhesive strips completely unless your health care provider tells you to do that.  Check your incision area every day for signs of infection. Check for: ? Redness, swelling, or pain. ? Fluid or blood. ? Warmth. ? Pus or a bad smell.  Do not take baths, swim, or use a hot tub until your health care provider approves. Ask your health care provider if you can take showers. Activity   Return to your normal activities as told by your health care provider. Ask your health care provider what activities are safe for you.  Do not drive for 24 hours if you were given a sedative during your procedure. General instructions  Follow instructions from your health care provider about how to manage your implantable loop recorder and transmit the  information. Learn how to activate a recording if this is necessary for your type of device.  Take over-the-counter and prescription medicines only as told by your health care provider.  Keep all follow-up visits as told by your health care provider. This is important. Contact a health care provider if:  You have redness, swelling, or pain around your incision.  You have a fever.  You have pain that is not relieved by your pain medicine.  You have triggered your device because of fainting (syncope) or because of a heartbeat that feels like it is racing, slow, fluttering, or skipping (palpitations). Get help right away if you have:  Chest pain.  Difficulty breathing. Summary  After the procedure, it is common to have soreness or discomfort near the incision.  Change your dressing as told by your health care provider.  Follow instructions from your health care provider about how to manage your implantable loop recorder and transmit the information.  Keep all follow-up visits as told by your health care provider. This is important. This information is not intended to replace advice given to you by your health care provider. Make sure you discuss any questions you have with your health care provider. Document Released: 08/28/2015 Document Revised: 11/01/2017 Document Reviewed: 11/01/2017 Elsevier Patient Education  2020 [employer].

## 2019-07-21 DIAGNOSIS — Z0000 Encounter for general adult medical examination without abnormal findings: Secondary | ICD-10-CM | POA: Diagnosis not present

## 2019-08-03 ENCOUNTER — Telehealth (INDEPENDENT_AMBULATORY_CARE_PROVIDER_SITE_OTHER): Payer: BC Managed Care – PPO | Admitting: *Deleted

## 2019-08-03 ENCOUNTER — Other Ambulatory Visit: Payer: Self-pay

## 2019-08-03 DIAGNOSIS — R55 Syncope and collapse: Secondary | ICD-10-CM

## 2019-08-03 NOTE — Progress Notes (Signed)
Patient verbally consented to loop recorder explant wound check via virtual visit due to COVID-19.  Loop recorder explant wound check performed via telephone visit. Patient reports Steri-strips fell off last week, incision edges fully approximated and well healed. No drainage, redness, swelling, fever, or chills noted. Patient aware to call if any concerns about incision. All questions answered. ROV with Dr. Sallyanne Kuster PRN.

## 2019-08-16 DIAGNOSIS — Z83438 Family history of other disorder of lipoprotein metabolism and other lipidemia: Secondary | ICD-10-CM | POA: Diagnosis not present

## 2019-08-16 DIAGNOSIS — Z13220 Encounter for screening for lipoid disorders: Secondary | ICD-10-CM | POA: Diagnosis not present

## 2019-08-16 DIAGNOSIS — R030 Elevated blood-pressure reading, without diagnosis of hypertension: Secondary | ICD-10-CM | POA: Diagnosis not present

## 2019-08-16 DIAGNOSIS — Z125 Encounter for screening for malignant neoplasm of prostate: Secondary | ICD-10-CM | POA: Diagnosis not present

## 2019-11-08 DIAGNOSIS — I10 Essential (primary) hypertension: Secondary | ICD-10-CM | POA: Diagnosis not present

## 2019-11-08 DIAGNOSIS — G4733 Obstructive sleep apnea (adult) (pediatric): Secondary | ICD-10-CM | POA: Diagnosis not present

## 2019-11-08 DIAGNOSIS — M542 Cervicalgia: Secondary | ICD-10-CM | POA: Diagnosis not present

## 2020-05-16 DIAGNOSIS — I10 Essential (primary) hypertension: Secondary | ICD-10-CM | POA: Diagnosis not present

## 2020-05-16 DIAGNOSIS — M542 Cervicalgia: Secondary | ICD-10-CM | POA: Diagnosis not present

## 2020-05-16 DIAGNOSIS — R002 Palpitations: Secondary | ICD-10-CM | POA: Diagnosis not present

## 2020-05-16 DIAGNOSIS — G4733 Obstructive sleep apnea (adult) (pediatric): Secondary | ICD-10-CM | POA: Diagnosis not present

## 2020-07-18 DIAGNOSIS — M26602 Left temporomandibular joint disorder, unspecified: Secondary | ICD-10-CM | POA: Diagnosis not present

## 2020-07-18 DIAGNOSIS — M47892 Other spondylosis, cervical region: Secondary | ICD-10-CM | POA: Diagnosis not present

## 2020-07-21 DIAGNOSIS — Z13220 Encounter for screening for lipoid disorders: Secondary | ICD-10-CM | POA: Diagnosis not present

## 2020-07-21 DIAGNOSIS — Z125 Encounter for screening for malignant neoplasm of prostate: Secondary | ICD-10-CM | POA: Diagnosis not present

## 2020-07-21 DIAGNOSIS — Z0000 Encounter for general adult medical examination without abnormal findings: Secondary | ICD-10-CM | POA: Diagnosis not present

## 2020-07-21 DIAGNOSIS — Z23 Encounter for immunization: Secondary | ICD-10-CM | POA: Diagnosis not present

## 2020-07-21 DIAGNOSIS — I10 Essential (primary) hypertension: Secondary | ICD-10-CM | POA: Diagnosis not present

## 2020-07-26 DIAGNOSIS — M26602 Left temporomandibular joint disorder, unspecified: Secondary | ICD-10-CM | POA: Diagnosis not present

## 2020-07-26 DIAGNOSIS — M47892 Other spondylosis, cervical region: Secondary | ICD-10-CM | POA: Diagnosis not present

## 2020-08-28 DIAGNOSIS — G4733 Obstructive sleep apnea (adult) (pediatric): Secondary | ICD-10-CM | POA: Diagnosis not present

## 2020-09-21 DIAGNOSIS — M542 Cervicalgia: Secondary | ICD-10-CM | POA: Diagnosis not present

## 2020-09-25 DIAGNOSIS — G4733 Obstructive sleep apnea (adult) (pediatric): Secondary | ICD-10-CM | POA: Diagnosis not present

## 2020-10-02 DIAGNOSIS — M542 Cervicalgia: Secondary | ICD-10-CM | POA: Diagnosis not present

## 2020-10-12 DIAGNOSIS — M542 Cervicalgia: Secondary | ICD-10-CM | POA: Diagnosis not present

## 2020-10-19 DIAGNOSIS — M542 Cervicalgia: Secondary | ICD-10-CM | POA: Diagnosis not present

## 2020-10-23 DIAGNOSIS — M542 Cervicalgia: Secondary | ICD-10-CM | POA: Diagnosis not present

## 2020-11-06 DIAGNOSIS — M542 Cervicalgia: Secondary | ICD-10-CM | POA: Diagnosis not present

## 2020-11-20 DIAGNOSIS — M542 Cervicalgia: Secondary | ICD-10-CM | POA: Diagnosis not present

## 2020-11-30 DIAGNOSIS — M542 Cervicalgia: Secondary | ICD-10-CM | POA: Diagnosis not present

## 2020-12-26 DIAGNOSIS — R42 Dizziness and giddiness: Secondary | ICD-10-CM | POA: Diagnosis not present

## 2020-12-26 DIAGNOSIS — I10 Essential (primary) hypertension: Secondary | ICD-10-CM | POA: Diagnosis not present

## 2021-01-23 DIAGNOSIS — H8112 Benign paroxysmal vertigo, left ear: Secondary | ICD-10-CM | POA: Diagnosis not present

## 2021-01-26 DIAGNOSIS — R42 Dizziness and giddiness: Secondary | ICD-10-CM | POA: Diagnosis not present

## 2021-03-01 DIAGNOSIS — R682 Dry mouth, unspecified: Secondary | ICD-10-CM | POA: Diagnosis not present

## 2021-03-01 DIAGNOSIS — I10 Essential (primary) hypertension: Secondary | ICD-10-CM | POA: Diagnosis not present

## 2021-03-01 DIAGNOSIS — Z131 Encounter for screening for diabetes mellitus: Secondary | ICD-10-CM | POA: Diagnosis not present

## 2021-03-01 DIAGNOSIS — Z03818 Encounter for observation for suspected exposure to other biological agents ruled out: Secondary | ICD-10-CM | POA: Diagnosis not present

## 2021-04-18 DIAGNOSIS — R2681 Unsteadiness on feet: Secondary | ICD-10-CM | POA: Diagnosis not present

## 2021-04-18 DIAGNOSIS — M542 Cervicalgia: Secondary | ICD-10-CM | POA: Diagnosis not present

## 2021-04-18 DIAGNOSIS — H8112 Benign paroxysmal vertigo, left ear: Secondary | ICD-10-CM | POA: Diagnosis not present

## 2021-04-25 DIAGNOSIS — R2681 Unsteadiness on feet: Secondary | ICD-10-CM | POA: Diagnosis not present

## 2021-04-25 DIAGNOSIS — M542 Cervicalgia: Secondary | ICD-10-CM | POA: Diagnosis not present

## 2021-04-25 DIAGNOSIS — H8112 Benign paroxysmal vertigo, left ear: Secondary | ICD-10-CM | POA: Diagnosis not present

## 2021-06-06 DIAGNOSIS — Z20822 Contact with and (suspected) exposure to covid-19: Secondary | ICD-10-CM | POA: Diagnosis not present

## 2021-06-27 DIAGNOSIS — Z23 Encounter for immunization: Secondary | ICD-10-CM | POA: Diagnosis not present

## 2021-06-27 DIAGNOSIS — I10 Essential (primary) hypertension: Secondary | ICD-10-CM | POA: Diagnosis not present

## 2021-06-27 DIAGNOSIS — M542 Cervicalgia: Secondary | ICD-10-CM | POA: Diagnosis not present

## 2021-06-27 DIAGNOSIS — R7303 Prediabetes: Secondary | ICD-10-CM | POA: Diagnosis not present

## 2021-08-02 DIAGNOSIS — R7303 Prediabetes: Secondary | ICD-10-CM | POA: Diagnosis not present

## 2021-08-02 DIAGNOSIS — Z23 Encounter for immunization: Secondary | ICD-10-CM | POA: Diagnosis not present

## 2021-08-02 DIAGNOSIS — Z0000 Encounter for general adult medical examination without abnormal findings: Secondary | ICD-10-CM | POA: Diagnosis not present

## 2021-08-02 DIAGNOSIS — E7800 Pure hypercholesterolemia, unspecified: Secondary | ICD-10-CM | POA: Diagnosis not present

## 2021-08-02 DIAGNOSIS — I10 Essential (primary) hypertension: Secondary | ICD-10-CM | POA: Diagnosis not present

## 2022-02-08 DIAGNOSIS — J301 Allergic rhinitis due to pollen: Secondary | ICD-10-CM | POA: Diagnosis not present

## 2022-02-08 DIAGNOSIS — Z23 Encounter for immunization: Secondary | ICD-10-CM | POA: Diagnosis not present

## 2022-02-08 DIAGNOSIS — I10 Essential (primary) hypertension: Secondary | ICD-10-CM | POA: Diagnosis not present

## 2022-02-08 DIAGNOSIS — M542 Cervicalgia: Secondary | ICD-10-CM | POA: Diagnosis not present

## 2022-02-08 DIAGNOSIS — R7303 Prediabetes: Secondary | ICD-10-CM | POA: Diagnosis not present

## 2022-02-21 DIAGNOSIS — Z1211 Encounter for screening for malignant neoplasm of colon: Secondary | ICD-10-CM | POA: Diagnosis not present

## 2022-02-21 DIAGNOSIS — Z8371 Family history of colonic polyps: Secondary | ICD-10-CM | POA: Diagnosis not present

## 2022-05-13 DIAGNOSIS — R194 Change in bowel habit: Secondary | ICD-10-CM | POA: Diagnosis not present

## 2022-05-13 DIAGNOSIS — Z20822 Contact with and (suspected) exposure to covid-19: Secondary | ICD-10-CM | POA: Diagnosis not present

## 2022-08-27 DIAGNOSIS — Z125 Encounter for screening for malignant neoplasm of prostate: Secondary | ICD-10-CM | POA: Diagnosis not present

## 2022-08-27 DIAGNOSIS — R7303 Prediabetes: Secondary | ICD-10-CM | POA: Diagnosis not present

## 2022-08-27 DIAGNOSIS — E7800 Pure hypercholesterolemia, unspecified: Secondary | ICD-10-CM | POA: Diagnosis not present

## 2022-08-27 DIAGNOSIS — I10 Essential (primary) hypertension: Secondary | ICD-10-CM | POA: Diagnosis not present

## 2022-08-27 DIAGNOSIS — Z0000 Encounter for general adult medical examination without abnormal findings: Secondary | ICD-10-CM | POA: Diagnosis not present

## 2022-08-27 DIAGNOSIS — Z23 Encounter for immunization: Secondary | ICD-10-CM | POA: Diagnosis not present

## 2022-09-04 DIAGNOSIS — M67912 Unspecified disorder of synovium and tendon, left shoulder: Secondary | ICD-10-CM | POA: Diagnosis not present

## 2022-09-04 DIAGNOSIS — M4722 Other spondylosis with radiculopathy, cervical region: Secondary | ICD-10-CM | POA: Diagnosis not present

## 2022-09-11 ENCOUNTER — Emergency Department (HOSPITAL_BASED_OUTPATIENT_CLINIC_OR_DEPARTMENT_OTHER): Payer: BC Managed Care – PPO

## 2022-09-11 ENCOUNTER — Other Ambulatory Visit: Payer: Self-pay

## 2022-09-11 ENCOUNTER — Encounter (HOSPITAL_BASED_OUTPATIENT_CLINIC_OR_DEPARTMENT_OTHER): Payer: Self-pay

## 2022-09-11 ENCOUNTER — Emergency Department (HOSPITAL_BASED_OUTPATIENT_CLINIC_OR_DEPARTMENT_OTHER)
Admission: EM | Admit: 2022-09-11 | Discharge: 2022-09-11 | Disposition: A | Discharge status: Home / Self Care | Payer: BC Managed Care – PPO | Attending: Emergency Medicine | Admitting: Emergency Medicine

## 2022-09-11 DIAGNOSIS — I10 Essential (primary) hypertension: Secondary | ICD-10-CM | POA: Diagnosis not present

## 2022-09-11 DIAGNOSIS — R42 Dizziness and giddiness: Secondary | ICD-10-CM | POA: Diagnosis not present

## 2022-09-11 DIAGNOSIS — R000 Tachycardia, unspecified: Secondary | ICD-10-CM | POA: Diagnosis not present

## 2022-09-11 DIAGNOSIS — R55 Syncope and collapse: Secondary | ICD-10-CM | POA: Insufficient documentation

## 2022-09-11 DIAGNOSIS — R231 Pallor: Secondary | ICD-10-CM | POA: Diagnosis not present

## 2022-09-11 DIAGNOSIS — R002 Palpitations: Secondary | ICD-10-CM | POA: Diagnosis not present

## 2022-09-11 HISTORY — DX: Essential (primary) hypertension: I10

## 2022-09-11 LAB — BASIC METABOLIC PANEL
Anion gap: 7 (ref 5–15)
BUN: 17 mg/dL (ref 6–20)
CO2: 24 mmol/L (ref 22–32)
Calcium: 9.2 mg/dL (ref 8.9–10.3)
Chloride: 107 mmol/L (ref 98–111)
Creatinine, Ser: 0.94 mg/dL (ref 0.61–1.24)
GFR, Estimated: >60 mL/min (ref >60)
Glucose, Bld: 131 mg/dL — ABNORMAL HIGH (ref 70–99)
Potassium: 3.6 mmol/L (ref 3.5–5.1)
Sodium: 138 mmol/L (ref 135–145)

## 2022-09-11 LAB — CBC
HCT: 46.4 % (ref 39.0–52.0)
Hemoglobin: 15.7 g/dL (ref 13.0–17.0)
MCH: 29.3 pg (ref 26.0–34.0)
MCHC: 33.8 g/dL (ref 30.0–36.0)
MCV: 86.6 fL (ref 80.0–100.0)
Platelets: 177 10*3/uL (ref 150–400)
RBC: 5.36 MIL/uL (ref 4.22–5.81)
RDW: 12.9 % (ref 11.5–15.5)
WBC: 6.7 10*3/uL (ref 4.0–10.5)
nRBC: 0 % (ref 0.0–0.2)

## 2022-09-11 LAB — MAGNESIUM: Magnesium: 2 mg/dL (ref 1.7–2.4)

## 2022-09-11 LAB — TROPONIN I (HIGH SENSITIVITY)
Troponin I (High Sensitivity): 5 ng/L (ref <18)
Troponin I (High Sensitivity): 17 ng/L (ref <18)

## 2022-09-11 NOTE — Discharge Instructions (Signed)
Your history, exam, workup today did not reveal acute abnormalities to explain your symptoms.  I spoke with cardiology who felt you are appropriate for close cardiology follow-up and discharge tonight.  Your cardiac enzymes were negative both times we checked them as was the rest of your workup.  Please rest and stay hydrated and follow-up with cardiology.  If any symptoms change or worsen acutely, please return to the nearest emergency department.

## 2022-09-11 NOTE — ED Provider Notes (Signed)
[facility] EMERGENCY DEPARTMENT Provider Note   CSN: [id number] Arrival date & time: 09/11/22  1706     History  Chief Complaint  Patient presents with   Tachycardia    Juan Frank is a 56 y.o. male.  The history is provided by the patient, medical records and a significant other. No language interpreter was used.  Palpitations Palpitations quality:  Fast Onset quality:  Sudden Duration:  5 minutes Timing:  Sporadic Progression:  Resolved Chronicity:  Recurrent Context comment:  Recent steroids Relieved by:  Nothing Worsened by:  Nothing Ineffective treatments:  None tried Associated symptoms: no back pain, no chest pain, no chest pressure, no cough, no diaphoresis, no dizziness, no lower extremity edema, no malaise/fatigue, no nausea, no near-syncope, no numbness, no shortness of breath, no vomiting and no weakness   Risk factors: no hx of atrial fibrillation and no hx of PE        Home Medications Prior to Admission medications   Medication Sig Start Date End Date Taking? Authorizing Provider  cyclobenzaprine (FLEXERIL) 10 MG tablet Take 10 mg by mouth 3 (three) times daily as needed for muscle spasms.    [provider]  ibuprofen (ADVIL,MOTRIN) 200 MG tablet Take 600 mg by mouth every 6 (six) hours as needed for moderate pain.    [provider]  naproxen sodium (ANAPROX) 220 MG tablet Take 440 mg by mouth 2 (two) times daily as needed.     [provider]      Allergies    Penicillins    Review of Systems   Review of Systems  Constitutional:  Negative for chills, diaphoresis, fatigue, fever and malaise/fatigue.  HENT:  Negative for congestion.   Eyes:  Negative for visual disturbance.  Respiratory:  Negative for cough, chest tightness and shortness of breath.   Cardiovascular:  Positive for palpitations. Negative for chest pain and near-syncope.  Gastrointestinal:  Negative for nausea and vomiting.  Genitourinary:   Negative for flank pain.  Musculoskeletal:  Positive for neck pain (several weeks unchanged gettin worked up by ortho). Negative for back pain.  Neurological:  Negative for dizziness, weakness, light-headedness, numbness and headaches.  Psychiatric/Behavioral:  Negative for agitation and confusion.   All other systems reviewed and are negative.   Physical Exam Updated Vital Signs BP 124/86 (BP Location: Right Arm)   Pulse (!) 101   Temp 98.6 F (37 C)   Resp 20   Ht '5\' 11"'$  (1.803 m)   Wt 93.9 kg   SpO2 99%   BMI 28.87 kg/m  Physical Exam Vitals and nursing note reviewed.  Constitutional:      General: He is not in acute distress.    Appearance: He is well-developed. He is not ill-appearing, toxic-appearing or diaphoretic.  HENT:     Head: Normocephalic and atraumatic.     Nose: No congestion or rhinorrhea.     Mouth/Throat:     Mouth: Mucous membranes are moist.     Pharynx: No oropharyngeal exudate or posterior oropharyngeal erythema.  Eyes:     Extraocular Movements: Extraocular movements intact.     Conjunctiva/sclera: Conjunctivae normal.     Pupils: Pupils are equal, round, and reactive to light.  Cardiovascular:     Rate and Rhythm: Normal rate and regular rhythm.     Heart sounds: No murmur heard. Pulmonary:     Effort: Pulmonary effort is normal. No respiratory distress.     Breath sounds: Normal breath sounds. No wheezing,  rhonchi or rales.  Chest:     Chest wall: No tenderness.  Abdominal:     Palpations: Abdomen is soft.     Tenderness: There is no abdominal tenderness. There is no guarding or rebound.  Musculoskeletal:        General: No swelling or tenderness.     Cervical back: Neck supple. No tenderness.     Right lower leg: No edema.     Left lower leg: No edema.  Skin:    General: Skin is warm and dry.     Capillary Refill: Capillary refill takes less than 2 seconds.     Findings: No erythema or rash.  Neurological:     General: No focal deficit  present.     Mental Status: He is alert.  Psychiatric:        Mood and Affect: Mood normal.     ED Results / Procedures / Treatments   Labs (all labs ordered are listed, but only abnormal results are displayed) Labs Reviewed  BASIC METABOLIC PANEL - Abnormal; Notable for the following components:      Result Value   Glucose, Bld 131 (*)    All other components within normal limits  CBC  MAGNESIUM  TSH  TROPONIN I (HIGH SENSITIVITY)  TROPONIN I (HIGH SENSITIVITY)    EKG EKG Interpretation  Date/Time:  Wednesday September 11 2022 17:27:23 EST Ventricular Rate:  98 PR Interval:  152 QRS Duration: 82 QT Interval:  370 QTC Calculation: 472 R Axis:   77 Text Interpretation: Normal sinus rhythm T wave abnormality, consider inferior ischemia Abnormal ECG No previous ECGs available no prior ECG for comparison. Possible S1Q3T3 pattern. No STEMI Confirmed by Antony Blackbird [phone]) on 09/11/2022 5:38:56 PM  Radiology DG Chest 2 View  Result Date: 09/11/2022 CLINICAL DATA:  Cardiac palpitations. EXAM: CHEST - 2 VIEW COMPARISON:  None Available. FINDINGS: The heart size and mediastinal contours are within normal limits. Both lungs are clear. The visualized skeletal structures are unremarkable. IMPRESSION: No active cardiopulmonary disease. Electronically Signed   By: Marijo Conception M.D.   On: 09/11/2022 17:45    Procedures Procedures    Medications Ordered in ED Medications - No data to display  ED Course/ Medical Decision Making/ A&P                           Medical Decision Making Amount and/or Complexity of Data Reviewed Labs: ordered. Radiology: ordered.    Juan Frank is a 56 y.o. male with a past medical history significant for palpitations and near syncope as well as hypertension who presents with recurrent palpitations and tachycardia.  According to patient, he has not had palpitations for over 3 years when he had an extensive cardiac workup in the past.  He  said that he was seen by cardiology recently and had not had any symptoms.  He reports on Monday he started having some brief palpitations and his heart rate only went to the 120s before resolving quickly.  He says that today while at a meeting he had onset of palpitations and fast heart rate and it went into the 150s sustained for about 5 minutes before improving.  He otherwise denied symptoms of it including no chest pain, shortness of breath, lightheadedness, near syncope, diaphoresis, nausea, vomiting, or other complaints.  He does report that over the last few weeks he has been having pain in his left neck going down his left  arm with intermittent tingling and he is being worked up for possible pinched nerve versus [place]-Turner syndrome.  He reports he is scheduled to get an MRI upcoming.  He reports that this does not seem to related to the palpitation recurrence however he does report that he recently had a steroid shot, is been taking gabapentin, has not taken muscle relaxant, and has been using numbing patches.  He is being managed with orthopedics for this.  He otherwise denies new medications, drugs, but does report some new fish oil supplement.  Denies other complaints or any preceding symptoms before this episode.  As he has not had palpitations in several years he was concerned and came for evaluation.  He denies any symptoms at this time and heart rate is below 100 here.  EKG shows an S1Q3T3 pattern with no STEMI.  This does appear to be slightly different than previous EKGs I can see in the chart when looking through it.  Unfortunate, use is not allowing easy historical comparison at this time for some reason.  On exam, lungs clear and chest nontender.  No murmur.  Abdomen nontender.  Good pulses in extremities.  Patient resting comfortably.  He is not hypotensive, tachycardic, tachypneic, or hypoxic.  He is resting comfortably and is well-appearing.  Clinically I do suspect that the  combination of new medications including steroid shot may have caused his heart to be more jumpy.  Given his lack of other symptoms, sinus tachycardia versus SVT versus brief A-fib with RVR versus other etiologies considered.  EMS rhythm strip reported that his heart rate was between 150 and 160 initially.  Patient is now in sinus rhythm and well-appearing.  We will get screening labs to look for significant electrolyte abnormalities including a magnesium level and a TSH that will be sent off.  It may not return today.  Will touch base with cardiology as anticipate if his labs are reassuring he will be stable for discharge home and close outpatient cardiology follow-up.  Electrolytes overall reassuring.  Troponin negative x 2.  Magnesium normal.  CBC normal.  Chest x-ray shows no acute pneumonia.  TSH ordered and is in process but may not return tonight.  I spoke to cardiology in consultation who reviewed the EKG and story.  They feel that given his stability here with lack of any symptoms and otherwise reassuring lab workup he is safe for discharge home to follow-up with his cardiologist.  Patient and family agree.  You have other questions or concerns and was discharged in good condition with no further symptoms.        Final Clinical Impression(s) / ED Diagnoses Final diagnoses:  Palpitations    Rx / DC Orders ED Discharge Orders     None      Clinical Impression: 1. Palpitations     Disposition: Discharge  Condition: Good  I have discussed the results, Dx and Tx plan with the pt(& family if present). He/she/they expressed understanding and agree(s) with the plan. Discharge instructions discussed at great length. Strict return precautions discussed and pt &/or family have verbalized understanding of the instructions. No further questions at time of discharge.    Discharge Medication List as of 09/11/2022 10:39 PM      Follow Up: Shirline Frees, MD [address] [place] [address] [phone]     [facility] [address] [id number] [place] [phone]  Chrisha Vogel, Gwenyth Allegra, MD 09/11/22 [phone]

## 2022-09-11 NOTE — ED Triage Notes (Signed)
Pt states he was at work today when his heart rate increased to 150 and lasted for approx 5 min. Pt denies lightheadedness, CP, or ShOB. Pt has hx of same.

## 2022-09-12 DIAGNOSIS — R531 Weakness: Secondary | ICD-10-CM | POA: Diagnosis not present

## 2022-09-12 DIAGNOSIS — M25512 Pain in left shoulder: Secondary | ICD-10-CM | POA: Diagnosis not present

## 2022-09-12 DIAGNOSIS — M5412 Radiculopathy, cervical region: Secondary | ICD-10-CM | POA: Diagnosis not present

## 2022-09-12 LAB — TSH: TSH: 1.669 u[IU]/mL (ref 0.350–4.500)

## 2022-09-13 ENCOUNTER — Encounter: Payer: Self-pay | Admitting: Cardiovascular Disease

## 2022-09-13 ENCOUNTER — Other Ambulatory Visit: Payer: Self-pay

## 2022-09-13 DIAGNOSIS — R0602 Shortness of breath: Secondary | ICD-10-CM

## 2022-09-13 NOTE — Progress Notes (Signed)
On 09/11/2022 Juan Frank had several episodes of feeling sudden extreme fatigue that were associated with Fitbit notifications of tachycardia.  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  He did not have chest pain or shortness of breath but felt drained.  His symptoms relieved over several minutes of rest.  He did not have pleuritic discomfort, cough, leg swelling or calf tightness.  He did have some mild discomfort on the medial surface of his left thigh that sounds more like a muscle cramp.  The tachycardia happened again later in the day so he went to the emergency room in the afternoon.  EMS documented a heart rate of 150-160 bpm.  It was about a 6-hour wait until he was seen.  Vital signs and exam were normal, except that his resting heart rate was 101 bpm.  He was not hypoxic.  Routine labs were normal.  The chest x-ray was unremarkable.  ECG showed normal sinus rhythm with a heart rate of 98 bpm and prominent inferior ST segment depression T wave inversion with an S1Q3T3 pattern.  2 consecutive high-sensitivity troponin assays were within normal range.  D-dimer was not checked.  He returned to work the next day, but continues to have intermittent problems with tachycardia and feeling "drained".  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  He went to sit in his car in the parking lot and felt so weak that he called his wife to come pick him up.  His heart rate was 115 bpm, decreased to 85 bpm with rest, but prompt increased back up to 115 bpm when he started to move around again.  He does not have diaphoresis or flushing.  He denies recent problems with nausea, vomiting, diarrhea and his food intake has been normal.  He had a flu shot a couple of weeks ago and has not felt well since.  He has had some pain  in his left shoulder.  His ability to lift weights at the gym is substantially weaker than before (135 pounds versus a max of 220 pounds usually).  He wonders whether he has a pinched nerve in his neck.  In the past, he has had unexplained episodes of weakness which he attributed to hypoglycemia and which I thought might represent aborted episodes of vasovagal syncope.  At that time he had a similar pattern of gradual increase in heart rate and gradual resolution of the tachycardia, consistent with sinus tachycardia.  Echocardiogram, treadmill stress test and event monitor in 2015 all showed normal findings.  A tilt table test in 2017 was normal.  Implantable loop recorder monitoring (2017-2020)  never showed evidence of any meaningful arrhythmia.  The device was explanted in October 2020.  During all this time, his ECG has been normal until the tracing that was performed the other night in the emergency room on September 11, 2022  We repeated his ECG in the office today.  It shows sinus rhythm at almost 100 bpm, possible left atrial abnormality, S1Q3T3 pattern but with less prominent T wave changes in the inferior leads.  There is no right axis deviation.  He looks comfortable and is not in any acute distress or anxiety.  Heart rate is 96 bpm.  Oxygen saturation is 96% on room air.  His  blood pressure is high at 151/101, which is quite unusual for him.  Otherwise his physical exam is normal.  His symptoms are puzzling, but his ECG changes are new and concerning for acute cor pulmonale.  Will check a D-dimer and send off stat.  If that test is abnormal he should return to the emergency room to have a CT angiogram of the chest to exclude acute pulmonary embolism.  If the D-dimer comes back in normal range, we will continue workup as an outpatient.  Would recommend a repeat echocardiogram.  Consider testing for pheochromocytoma.

## 2022-09-14 LAB — D-DIMER, QUANTITATIVE: D-DIMER: 0.35 mg/L FEU (ref 0.00–0.49)

## 2022-09-16 ENCOUNTER — Telehealth: Payer: Self-pay | Admitting: Cardiovascular Disease

## 2022-09-16 DIAGNOSIS — M542 Cervicalgia: Secondary | ICD-10-CM | POA: Diagnosis not present

## 2022-09-16 DIAGNOSIS — M25512 Pain in left shoulder: Secondary | ICD-10-CM | POA: Diagnosis not present

## 2022-09-16 NOTE — Telephone Encounter (Signed)
LMTCB

## 2022-09-16 NOTE — Telephone Encounter (Signed)
Patient called to follow-up on orders for his Echo test.  Order not yet available for scheduling.

## 2022-09-17 ENCOUNTER — Other Ambulatory Visit (INDEPENDENT_AMBULATORY_CARE_PROVIDER_SITE_OTHER): Payer: BC Managed Care – PPO

## 2022-09-17 ENCOUNTER — Other Ambulatory Visit: Payer: Self-pay

## 2022-09-17 DIAGNOSIS — R0602 Shortness of breath: Secondary | ICD-10-CM | POA: Diagnosis not present

## 2022-09-17 DIAGNOSIS — R002 Palpitations: Secondary | ICD-10-CM

## 2022-09-17 DIAGNOSIS — R55 Syncope and collapse: Secondary | ICD-10-CM

## 2022-09-19 ENCOUNTER — Ambulatory Visit: Payer: BC Managed Care – PPO | Attending: Cardiovascular Disease | Admitting: Cardiovascular Disease

## 2022-09-19 ENCOUNTER — Ambulatory Visit (INDEPENDENT_AMBULATORY_CARE_PROVIDER_SITE_OTHER): Payer: BC Managed Care – PPO

## 2022-09-19 ENCOUNTER — Encounter: Payer: Self-pay | Admitting: Cardiovascular Disease

## 2022-09-19 VITALS — BP 126/78 | HR 89 | Ht 71.0 in | Wt 207.2 lb

## 2022-09-19 DIAGNOSIS — I5189 Other ill-defined heart diseases: Secondary | ICD-10-CM

## 2022-09-19 DIAGNOSIS — E340 Carcinoid syndrome: Secondary | ICD-10-CM | POA: Diagnosis not present

## 2022-09-19 DIAGNOSIS — D3500 Benign neoplasm of unspecified adrenal gland: Secondary | ICD-10-CM | POA: Diagnosis not present

## 2022-09-19 DIAGNOSIS — R55 Syncope and collapse: Secondary | ICD-10-CM | POA: Diagnosis not present

## 2022-09-19 DIAGNOSIS — R002 Palpitations: Secondary | ICD-10-CM | POA: Diagnosis not present

## 2022-09-19 DIAGNOSIS — E7800 Pure hypercholesterolemia, unspecified: Secondary | ICD-10-CM | POA: Diagnosis not present

## 2022-09-19 DIAGNOSIS — R0602 Shortness of breath: Secondary | ICD-10-CM

## 2022-09-19 DIAGNOSIS — M542 Cervicalgia: Secondary | ICD-10-CM | POA: Diagnosis not present

## 2022-09-19 DIAGNOSIS — M25512 Pain in left shoulder: Secondary | ICD-10-CM | POA: Diagnosis not present

## 2022-09-19 DIAGNOSIS — G8929 Other chronic pain: Secondary | ICD-10-CM | POA: Diagnosis not present

## 2022-09-19 LAB — ECHOCARDIOGRAM COMPLETE
Area-P 1/2: 3.91 cm2
S' Lateral: 2.15 cm

## 2022-09-19 NOTE — Progress Notes (Signed)
Cardiology Office Note:    Date:  09/19/2022   ID:  Juan Frank, DOB Jan 21, 1966, MRN [id number]  PCP:  Juan Frees, MD   [place] Providers Cardiologist:  Juan Klein, MD on 20  Referring MD: Juan Frees, MD   Chief Complaint  Patient presents with   Tachycardia    History of Present Illness:    Juan Frank is a 56 y.o. male with a hx of mild hypertension, otherwise generally excellent health, who has had many years of complaints of abrupt onset weakness and rapid palpitations.  The symptoms have waxed and waned in severity over the years and recently have become very prominent.  He had an extensive workup in 2013-2017 that included echocardiography, tilt table testing, arrhythmia monitoring (including 3 weeks of implantable loop recorder downloads), treadmill stress testing, without any identifiable cardiac abnormality.  Most recently he was evaluated in the emergency room on December 13.  Workup was unremarkable, except that his ECG showed a new S1Q3T3 pattern new and prominent T wave inversions in the inferior leads.  D-dimer was normal.  Repeat ECG still shows the Q waves in lead III, but the S wave only 1 in the 2 inversions are much less prominent.  He had another episode of palpitations that occurred about 30 minutes to an hour after eating 2 days ago.  It resolved spontaneously.  Again the biggest problems are rapid heartbeat and weakness. He has been monitoring his blood pressure on a daily basis and it has averaged around 120/85 mmHg, but was higher at 137/99 with a heart rate of 99 bpm during his episode.  He also reports that for the last several years he has had an unexplained sensation of burning on the dorsum of his forearms bilaterally (equivalent to flushing?).  He denies diaphoresis.  Continues to complain of pain on the left side of his neck and weakness in his left shoulder.  Had MRIs earlier today.  Echocardiogram performed today  has not yet been formally reported, but appears to be normal other than a reduction in global longitudinal strain.  Metabolic parameters for the causes of some weight gain.  His hemoglobin A1c is up to 6.0%.  Total cholesterol 209, HDL 47, LDL 150, triglycerides 106.  (Labs from 08/27/2022)  Past Medical History:  Diagnosis Date   Hypertension    Pre-syncope 02/19/2012    Past Surgical History:  Procedure Laterality Date   APPENDECTOMY     ELECTROPHYSIOLOGIC STUDY N/A 10/10/2015   Procedure: Tilt Table Study;  Surgeon: Juan Klein, MD;  Location: [facility];  Service: Cardiovascular;  Laterality: N/A;   EP IMPLANTABLE DEVICE N/A 10/10/2015   Procedure: Loop Recorder Insertion;  Surgeon: Juan Klein, MD;  Location: [facility];  Service: Cardiovascular;  Laterality: N/A;   TRANSTHORACIC ECHOCARDIOGRAM  02/27/2012   EF =>55%. ATRIAL SEPTUM IS ANEURYSMAL. [facility].   VASECTOMY      Current Medications: Current Meds  Medication Sig   Acetaminophen (TYLENOL ARTHRITIS EXT RELIEF PO) Take 2 tablets by mouth daily in the afternoon.   amLODipine (NORVASC) 5 MG tablet Take 5 mg by mouth daily.   Cyanocobalamin (B-12 PO) Take 1 tablet by mouth daily in the afternoon.   ibuprofen (ADVIL,MOTRIN) 200 MG tablet Take 600 mg by mouth every 6 (six) hours as needed for moderate pain.     Allergies:   Penicillins   Social History   Socioeconomic History   Marital status: Married  Spouse name: Not on file   Number of children: Not on file   Years of education: Not on file   Highest education level: Not on file  Occupational History   Not on file  Tobacco Use   Smoking status: Never   Smokeless tobacco: Never  Vaping Use   Vaping Use: Never used  Substance and Sexual Activity   Alcohol use: No   Drug use: No   Sexual activity: Not on file  Other Topics Concern   Not on file  Social History Narrative   Not on file   Social Determinants  of Health   Financial Resource Strain: Not on file  Food Insecurity: Not on file  Transportation Needs: Not on file  Physical Activity: Not on file  Stress: Not on file  Social Connections: Not on file     Family History: The patient's family history includes Diabetes in his father, mother, and sister; Sjogren's syndrome in his sister; Stroke in his maternal grandmother.  His daughter had a widespread small cell cancer of uncertain primary.  ROS:   Please see the history of present illness.     All other systems reviewed and are negative.  EKGs/Labs/Other Studies Reviewed:    The following studies were reviewed today: Reviewed images of echocardiogram today.  Normal left ventricular systolic and diastolic function, but GLS is low (accurate tracing?).  Borderline dilation of aortic root.  No significant valvular abnormalities or pericardial effusion.  EKG:  EKG is ordered today.  The ekg ordered today demonstrates normal sinus rhythm, deep but sharp Q waves in lead III and is somewhat slurred Q-wave in aVF, most likely due to an nonspecific IVCD with QRS duration right 100 ms.  No repolarization abnormalities.  QTc 472 ms.  Recent Labs: 09/11/2022: BUN 17; Creatinine, Ser 0.94; Hemoglobin 15.7; Magnesium 2.0; Platelets 177; Potassium 3.6; Sodium 138; TSH 1.669  Recent Lipid Panel No results found for: "CHOL", "TRIG", "HDL", "CHOLHDL", "VLDL", "LDLCALC", "LDLDIRECT"  08/27/2022 hemoglobin A1c 6.0%.   Total cholesterol 209, HDL 47, LDL 150, triglycerides 106.    Risk Assessment/Calculations:                Physical Exam:    VS:  BP 126/78 (BP Location: Left Arm, Patient Position: Sitting, Cuff Size: Large)   Pulse 89   Ht '5\' 11"'$  (1.803 m)   Wt 207 lb 3.2 oz (94 kg)   SpO2 97%   BMI 28.90 kg/m     Wt Readings from Last 3 Encounters:  09/19/22 207 lb 3.2 oz (94 kg)  09/11/22 207 lb (93.9 kg)  12/16/17 211 lb (95.7 kg)     GEN: Appears very muscular and athletic,  although mildly overweight, well nourished, well developed in no acute distress HEENT: Normal NECK: No JVD; No carotid bruits LYMPHATICS: No lymphadenopathy CARDIAC: RRR, no murmurs, rubs, gallops RESPIRATORY:  Clear to auscultation without rales, wheezing or rhonchi  ABDOMEN: Soft, non-tender, non-distended MUSCULOSKELETAL:  No edema; No deformity  SKIN: Warm and dry NEUROLOGIC:  Alert and oriented x 3 PSYCHIATRIC:  Normal affect   ASSESSMENT:    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 Pheochromocytoma, unspecified laterality   3. Hypercholesterolemia    PLAN:    In order of problems listed above:  Tachycardia: This has consistently been demonstrated to be sinus tachycardia including when he had repeated spells while having an implantable loop recorder.  The mechanism is unclear, but associates profound weakness even though his blood pressure  is normal.  Maybe has a component of flushing.  Extensive cardiovascular workup has not revealed an etiology.  Consider unusual diagnosis such as pheochromocytoma or carcinoid syndrome.  Will perform the necessary tests. HTN: Well-controlled on amlodipine monotherapy. Prediabetes: Discussed restricting sweets and starchy foods, he exercises frequently. 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      The     Medication Adjustments/Labs and Tests Ordered: Current medicines are reviewed at length with the patient today.  Concerns regarding medicines are outlined above.  Orders Placed This Encounter  Procedures   CT CARDIAC SCORING (SELF PAY ONLY)   Metanephrines, urine, 24 hour   Metanephrines, plasma   Catecholamines, fractionated, plasma   5 HIAA, quantitative, Urine, 24 hour   No orders of the defined types were placed in this encounter.   Patient Instructions  Medication Instructions:  Not needed  *If you need a refill on your cardiac medications before  your next appointment, please call your pharmacy*   Lab Work: 5 HIAA- 24 hr urine Catecholamine ,plasma Metanephrine 24 hr urine Plasma metanephrine  If you have labs (blood work) drawn today and your tests are completely normal, you will receive your results only by: [place] (if you have MyChart) OR A paper copy in the mail If you have any lab test that is abnormal or we need to change your treatment, we will call you to review the results.   Testing/Procedures: CT coronary calcium score.   Test locations:  [facility]   This is $99 out of pocket.   Coronary CalciumScan A coronary calcium scan is an imaging test used to look for deposits of calcium and other fatty materials (plaques) in the inner lining of the blood vessels of the heart (coronary arteries). These deposits of calcium and plaques can partly clog and narrow the coronary arteries without producing any symptoms or warning signs. This puts a person at risk for a heart attack. This test can detect these deposits before symptoms develop. Tell a health care provider about: Any allergies you have. All medicines you are taking, including vitamins, herbs, eye drops, creams, and over-the-counter medicines. Any problems you or family members have had with anesthetic medicines. Any blood disorders you have. Any surgeries you have had. Any medical conditions you have. Whether you are pregnant or may be pregnant. What are the risks? Generally, this is a safe procedure. However, problems may occur, including: Harm to a pregnant woman and her unborn baby. This test involves the use of radiation. Radiation exposure can be dangerous to a pregnant woman and her unborn baby. If you are pregnant, you generally should not have this procedure done. Slight increase in the risk of cancer. This is because of the radiation involved in the test. What happens before the procedure? No preparation is  needed for this procedure. What happens during the procedure? You will undress and remove any jewelry around your neck or chest. You will put on a hospital gown. Sticky electrodes will be placed on your chest. The electrodes will be connected to an electrocardiogram (ECG) machine to record a tracing of the electrical activity of your heart. A CT scanner will take pictures of your heart. During this time, you will be asked to lie still and hold your breath for 2-3 seconds while a picture of your heart is being taken. The procedure may vary among health care providers and hospitals. What happens after  the procedure? You can get dressed. You can return to your normal activities. It is up to you to get the results of your test. Ask your health care provider, or the department that is doing the test, when your results will be ready. Summary A coronary calcium scan is an imaging test used to look for deposits of calcium and other fatty materials (plaques) in the inner lining of the blood vessels of the heart (coronary arteries). Generally, this is a safe procedure. Tell your health care provider if you are pregnant or may be pregnant. No preparation is needed for this procedure. A CT scanner will take pictures of your heart. You can return to your normal activities after the scan is done. This information is not intended to replace advice given to you by your health care provider. Make sure you discuss any questions you have with your health care provider. Document Released: 03/14/2008 Document Revised: 08/05/2016 Document Reviewed: 08/05/2016 Elsevier Interactive Patient Education  2017 [place]: At [facility], you and your health needs are our priority.  As part of our continuing mission to provide you with exceptional heart care, we have created designated Provider Care Teams.  These Care Teams include your primary Cardiologist (physician) and Advanced Practice Providers  (APPs -  Physician Assistants and Nurse Practitioners) who all work together to provide you with the care you need, when you need it.     Your next appointment:   2 to 3 month(s)  The format for your next appointment:   In Person  Provider:   Sanda Klein, MD    Signed, Juan Klein, MD  09/19/2022 1:33 PM    [place]

## 2022-09-19 NOTE — Patient Instructions (Addendum)
Medication Instructions:  Not needed  *If you need a refill on your cardiac medications before your next appointment, please call your pharmacy*   Lab Work: 5 HIAA- 24 hr urine Catecholamine ,plasma Metanephrine 24 hr urine Plasma metanephrine  If you have labs (blood work) drawn today and your tests are completely normal, you will receive your results only by: [place] (if you have MyChart) OR A paper copy in the mail If you have any lab test that is abnormal or we need to change your treatment, we will call you to review the results.   Testing/Procedures: CT coronary calcium score.   Test locations:  [place]   This is $99 out of pocket.   Coronary CalciumScan A coronary calcium scan is an imaging test used to look for deposits of calcium and other fatty materials (plaques) in the inner lining of the blood vessels of the heart (coronary arteries). These deposits of calcium and plaques can partly clog and narrow the coronary arteries without producing any symptoms or warning signs. This puts a person at risk for a heart attack. This test can detect these deposits before symptoms develop. Tell a health care provider about: Any allergies you have. All medicines you are taking, including vitamins, herbs, eye drops, creams, and over-the-counter medicines. Any problems you or family members have had with anesthetic medicines. Any blood disorders you have. Any surgeries you have had. Any medical conditions you have. Whether you are pregnant or may be pregnant. What are the risks? Generally, this is a safe procedure. However, problems may occur, including: Harm to a pregnant woman and her unborn baby. This test involves the use of radiation. Radiation exposure can be dangerous to a pregnant woman and her unborn baby. If you are pregnant, you generally should not have this procedure done. Slight increase in the risk of cancer. This is because  of the radiation involved in the test. What happens before the procedure? No preparation is needed for this procedure. What happens during the procedure? You will undress and remove any jewelry around your neck or chest. You will put on a hospital gown. Sticky electrodes will be placed on your chest. The electrodes will be connected to an electrocardiogram (ECG) machine to record a tracing of the electrical activity of your heart. A CT scanner will take pictures of your heart. During this time, you will be asked to lie still and hold your breath for 2-3 seconds while a picture of your heart is being taken. The procedure may vary among health care providers and hospitals. What happens after the procedure? You can get dressed. You can return to your normal activities. It is up to you to get the results of your test. Ask your health care provider, or the department that is doing the test, when your results will be ready. Summary A coronary calcium scan is an imaging test used to look for deposits of calcium and other fatty materials (plaques) in the inner lining of the blood vessels of the heart (coronary arteries). Generally, this is a safe procedure. Tell your health care provider if you are pregnant or may be pregnant. No preparation is needed for this procedure. A CT scanner will take pictures of your heart. You can return to your normal activities after the scan is done. This information is not intended to replace advice given to you by your health care provider. Make sure you discuss any questions you have with your health care provider.  Document Released: 03/14/2008 Document Revised: 08/05/2016 Document Reviewed: 08/05/2016 Elsevier Interactive Patient Education  2017 [place]: At [facility], you and your health needs are our priority.  As part of our continuing mission to provide you with exceptional heart care, we have created designated Provider Care Teams.   These Care Teams include your primary Cardiologist (physician) and Advanced Practice Providers (APPs -  Physician Assistants and Nurse Practitioners) who all work together to provide you with the care you need, when you need it.     Your next appointment:   2 to 3 month(s)  The format for your next appointment:   In Person  Provider:   Sanda Klein, MD

## 2022-09-24 DIAGNOSIS — M67912 Unspecified disorder of synovium and tendon, left shoulder: Secondary | ICD-10-CM | POA: Diagnosis not present

## 2022-09-25 DIAGNOSIS — M67912 Unspecified disorder of synovium and tendon, left shoulder: Secondary | ICD-10-CM | POA: Diagnosis not present

## 2022-09-25 DIAGNOSIS — M4722 Other spondylosis with radiculopathy, cervical region: Secondary | ICD-10-CM | POA: Diagnosis not present

## 2022-09-26 DIAGNOSIS — E340 Carcinoid syndrome: Secondary | ICD-10-CM | POA: Diagnosis not present

## 2022-09-26 DIAGNOSIS — D3500 Benign neoplasm of unspecified adrenal gland: Secondary | ICD-10-CM | POA: Diagnosis not present

## 2022-09-26 DIAGNOSIS — I5189 Other ill-defined heart diseases: Secondary | ICD-10-CM | POA: Diagnosis not present

## 2022-09-26 NOTE — Addendum Note (Signed)
Addended by: Waylan Rocher on: 09/26/2022 09:41 AM   Modules accepted: Orders

## 2022-09-30 LAB — 5 HIAA, QUANTITATIVE, URINE, 24 HOUR
5-HIAA, Ur: 1.2 mg/L
5-HIAA,Quant.,24 Hr Urine: 3.6 mg/24 hr (ref 0.0–14.9)

## 2022-09-30 LAB — METANEPHRINES, URINE, 24 HOUR
Metaneph Total, Ur: 54 ug/L
Metanephrines, 24H Ur: 162 ug/24 hr (ref 58–276)
Normetanephrine, 24H Ur: 324 ug/24 hr (ref 156–729)
Normetanephrine, Ur: 108 ug/L

## 2022-10-01 DIAGNOSIS — M25512 Pain in left shoulder: Secondary | ICD-10-CM | POA: Diagnosis not present

## 2022-10-01 DIAGNOSIS — M5412 Radiculopathy, cervical region: Secondary | ICD-10-CM | POA: Diagnosis not present

## 2022-10-01 DIAGNOSIS — R531 Weakness: Secondary | ICD-10-CM | POA: Diagnosis not present

## 2022-10-03 ENCOUNTER — Ambulatory Visit (HOSPITAL_BASED_OUTPATIENT_CLINIC_OR_DEPARTMENT_OTHER)
Admission: RE | Admit: 2022-10-03 | Discharge: 2022-10-03 | Disposition: A | Discharge status: Home / Self Care | Payer: Self-pay | Source: Ambulatory Visit | Attending: Cardiovascular Disease | Admitting: Cardiovascular Disease

## 2022-10-03 DIAGNOSIS — M5412 Radiculopathy, cervical region: Secondary | ICD-10-CM | POA: Diagnosis not present

## 2022-10-03 DIAGNOSIS — M25512 Pain in left shoulder: Secondary | ICD-10-CM | POA: Diagnosis not present

## 2022-10-03 DIAGNOSIS — E7800 Pure hypercholesterolemia, unspecified: Secondary | ICD-10-CM | POA: Insufficient documentation

## 2022-10-03 DIAGNOSIS — R531 Weakness: Secondary | ICD-10-CM | POA: Diagnosis not present

## 2022-10-03 LAB — CATECHOLAMINES, FRACTIONATED, PLASMA
Dopamine: 69 pg/mL — ABNORMAL HIGH (ref 0–48)
Epinephrine: 100 pg/mL — ABNORMAL HIGH (ref 0–62)
Norepinephrine: 540 pg/mL (ref 0–874)

## 2022-10-03 LAB — METANEPHRINES, PLASMA
Metanephrine, Free: 47.8 pg/mL (ref 0.0–88.0)
Normetanephrine, Free: 98 pg/mL (ref 0.0–244.0)

## 2022-10-08 DIAGNOSIS — M5412 Radiculopathy, cervical region: Secondary | ICD-10-CM | POA: Diagnosis not present

## 2022-10-08 DIAGNOSIS — M25512 Pain in left shoulder: Secondary | ICD-10-CM | POA: Diagnosis not present

## 2022-10-08 DIAGNOSIS — R531 Weakness: Secondary | ICD-10-CM | POA: Diagnosis not present

## 2022-10-17 DIAGNOSIS — M5412 Radiculopathy, cervical region: Secondary | ICD-10-CM | POA: Diagnosis not present

## 2022-10-17 DIAGNOSIS — M25512 Pain in left shoulder: Secondary | ICD-10-CM | POA: Diagnosis not present

## 2022-10-17 DIAGNOSIS — R531 Weakness: Secondary | ICD-10-CM | POA: Diagnosis not present

## 2022-10-24 DIAGNOSIS — R531 Weakness: Secondary | ICD-10-CM | POA: Diagnosis not present

## 2022-10-24 DIAGNOSIS — M25512 Pain in left shoulder: Secondary | ICD-10-CM | POA: Diagnosis not present

## 2022-10-24 DIAGNOSIS — M5412 Radiculopathy, cervical region: Secondary | ICD-10-CM | POA: Diagnosis not present

## 2022-10-31 DIAGNOSIS — R531 Weakness: Secondary | ICD-10-CM | POA: Diagnosis not present

## 2022-10-31 DIAGNOSIS — M5412 Radiculopathy, cervical region: Secondary | ICD-10-CM | POA: Diagnosis not present

## 2022-10-31 DIAGNOSIS — M25512 Pain in left shoulder: Secondary | ICD-10-CM | POA: Diagnosis not present

## 2022-11-15 DIAGNOSIS — M5412 Radiculopathy, cervical region: Secondary | ICD-10-CM | POA: Diagnosis not present

## 2022-11-15 DIAGNOSIS — R531 Weakness: Secondary | ICD-10-CM | POA: Diagnosis not present

## 2022-11-15 DIAGNOSIS — M25512 Pain in left shoulder: Secondary | ICD-10-CM | POA: Diagnosis not present

## 2022-11-20 DIAGNOSIS — M25512 Pain in left shoulder: Secondary | ICD-10-CM | POA: Diagnosis not present

## 2022-11-20 DIAGNOSIS — R531 Weakness: Secondary | ICD-10-CM | POA: Diagnosis not present

## 2022-11-20 DIAGNOSIS — M5412 Radiculopathy, cervical region: Secondary | ICD-10-CM | POA: Diagnosis not present

## 2022-12-06 ENCOUNTER — Ambulatory Visit: Payer: BC Managed Care – PPO | Attending: Cardiovascular Disease | Admitting: Cardiovascular Disease

## 2022-12-06 ENCOUNTER — Encounter: Payer: Self-pay | Admitting: Cardiovascular Disease

## 2022-12-06 VITALS — BP 128/84 | HR 72 | Ht 71.0 in | Wt 206.8 lb

## 2022-12-06 DIAGNOSIS — I10 Essential (primary) hypertension: Secondary | ICD-10-CM

## 2022-12-06 DIAGNOSIS — E7800 Pure hypercholesterolemia, unspecified: Secondary | ICD-10-CM

## 2022-12-06 DIAGNOSIS — R002 Palpitations: Secondary | ICD-10-CM | POA: Diagnosis not present

## 2022-12-06 DIAGNOSIS — R7303 Prediabetes: Secondary | ICD-10-CM

## 2022-12-06 NOTE — Progress Notes (Signed)
Cardiology Office Note:    Date:  12/06/2022   ID:  KOLBEE THIELEN, DOB 1965-10-20, MRN [id number]  PCP:  Shirline Frees, MD   [facility] Cardiologist:  Sanda Klein, MD on 20  Referring MD: Shirline Frees, MD   No chief complaint on file.   History of Present Illness:    Juan Frank is a 57 y.o. male with a hx of mild hypertension, otherwise generally excellent health, who has had many years of complaints of abrupt onset weakness and rapid palpitations.  The symptoms have waxed and waned in severity over the years and recently have become very prominent.  He had an extensive workup in 2013-2017 that included echocardiography, tilt table testing, arrhythmia monitoring (including 3 weeks of implantable loop recorder downloads), treadmill stress testing, without any identifiable cardiac abnormality.  Over the last several years, he has been intermittently troubled by unexpected unprovoked spells of rapid regular palpitations, often associated with forearm paresthesias and sensation of weakness.  Left neck/shoulder discomfort is improving with physical therapy.  Cardiac workup has been unrevealing to date.  3 years of implantable loop recorder monitoring did not show any arrhythmia.  Coronary calcium score was 0 in January 2024.  Lab tests for pheochromocytoma and carcinoid syndrome were also normal.  His most recent echocardiogram does show some very mild LVH (maximum wall thickness 1.18 cm) and borderline dilation of the ascending aorta (38 mm) that are probably related to hypertension.  Mitral annulus diastolic velocities are normal and is not dilated.  Metabolic parameters became a little worse following weight gain.  His hemoglobin A1c is up to 6.0%.  Total cholesterol 209, HDL 47, LDL 150, triglycerides 106.  (Labs from 08/27/2022).  Since those labs were drawn he has increased exercise, reduce the amount of saturated fats in his diet and has lost about 5  pounds.  Past Medical History:  Diagnosis Date   Hypertension    Pre-syncope 02/19/2012    Past Surgical History:  Procedure Laterality Date   APPENDECTOMY     ELECTROPHYSIOLOGIC STUDY N/A 10/10/2015   Procedure: Tilt Table Study;  Surgeon: Sanda Klein, MD;  Location: [facility];  Service: Cardiovascular;  Laterality: N/A;   EP IMPLANTABLE DEVICE N/A 10/10/2015   Procedure: Loop Recorder Insertion;  Surgeon: Sanda Klein, MD;  Location: [facility];  Service: Cardiovascular;  Laterality: N/A;   TRANSTHORACIC ECHOCARDIOGRAM  02/27/2012   EF =>55%. ATRIAL SEPTUM IS ANEURYSMAL. [place].   VASECTOMY      Current Medications: Current Meds  Medication Sig   acetaminophen (TYLENOL) 500 MG tablet Take 500 mg by mouth every 6 (six) hours as needed for mild pain.   amLODipine (NORVASC) 5 MG tablet Take 5 mg by mouth daily.   Cyanocobalamin (B-12 PO) Take 1 tablet by mouth daily in the afternoon.   ibuprofen (ADVIL,MOTRIN) 200 MG tablet Take 600 mg by mouth every 6 (six) hours as needed for moderate pain.     Allergies:   Penicillins   Social History   Socioeconomic History   Marital status: Married    Spouse name: Not on file   Number of children: Not on file   Years of education: Not on file   Highest education level: Not on file  Occupational History   Not on file  Tobacco Use   Smoking status: Never   Smokeless tobacco: Never  Vaping Use   Vaping Use: Never used  Substance and Sexual Activity  Alcohol use: No   Drug use: No   Sexual activity: Not on file  Other Topics Concern   Not on file  Social History Narrative   Not on file   Social Determinants of Health   Financial Resource Strain: Not on file  Food Insecurity: Not on file  Transportation Needs: Not on file  Physical Activity: Not on file  Stress: Not on file  Social Connections: Not on file     Family History: The patient's family history includes Diabetes  in his father, mother, and sister; Sjogren's syndrome in his sister; Stroke in his maternal grandmother.  His daughter had a widespread small cell cancer of uncertain primary.  ROS:   Please see the history of present illness.     All other systems reviewed and are negative.  EKGs/Labs/Other Studies Reviewed:    The following studies were reviewed today: Reviewed images of echocardiogram today.  Normal left ventricular systolic and diastolic function, but GLS is low (accurate tracing?).  Borderline dilation of aortic root.  No significant valvular abnormalities or pericardial effusion.  EKG:  EKG is ordered today.  The ekg ordered today demonstrates normal sinus rhythm, deep but sharp Q waves in lead III and is somewhat slurred Q-wave in aVF, most likely due to an nonspecific IVCD with QRS duration right 100 ms.  No repolarization abnormalities.  QTc 472 ms.  Recent Labs: 09/11/2022: BUN 17; Creatinine, Ser 0.94; Hemoglobin 15.7; Magnesium 2.0; Platelets 177; Potassium 3.6; Sodium 138; TSH 1.669  Recent Lipid Panel No results found for: "CHOL", "TRIG", "HDL", "CHOLHDL", "VLDL", "LDLCALC", "LDLDIRECT"  08/27/2022 hemoglobin A1c 6.0%.   Total cholesterol 209, HDL 47, LDL 150, triglycerides 106.    Risk Assessment/Calculations:                Physical Exam:    VS:  BP 128/84 (BP Location: Left Arm, Patient Position: Sitting, Cuff Size: Large)   Pulse 72   Ht '5\' 11"'$  (1.803 m)   Wt 206 lb 12.8 oz (93.8 kg)   SpO2 97%   BMI 28.84 kg/m     Wt Readings from Last 3 Encounters:  12/06/22 206 lb 12.8 oz (93.8 kg)  09/19/22 207 lb 3.2 oz (94 kg)  09/11/22 207 lb (93.9 kg)     General: Alert, oriented x3, no distress, mildly overweight, but also very fit and athletic build Head: no evidence of trauma, PERRL, EOMI, no exophtalmos or lid lag, no myxedema, no xanthelasma; normal ears, nose and oropharynx Neck: normal jugular venous pulsations and no hepatojugular reflux; brisk carotid  pulses without delay and no carotid bruits Chest: clear to auscultation, no signs of consolidation by percussion or palpation, normal fremitus, symmetrical and full respiratory excursions Cardiovascular: normal position and quality of the apical impulse, regular rhythm, normal first and second heart sounds, no murmurs, rubs or gallops Abdomen: no tenderness or distention, no masses by palpation, no abnormal pulsatility or arterial bruits, normal bowel sounds, no hepatosplenomegaly Extremities: no clubbing, cyanosis or edema; 2+ radial, ulnar and brachial pulses bilaterally; 2+ right femoral, posterior tibial and dorsalis pedis pulses; 2+ left femoral, posterior tibial and dorsalis pedis pulses; no subclavian or femoral bruits Neurological: grossly nonfocal Psych: Normal mood and affect   ASSESSMENT:    1. Palpitations   2. Essential hypertension   3. Prediabetes   4. Hypercholesterolemia     PLAN:    In order of problems listed above:  Tachycardia: Extensive workup looking for structural cardiac abnormalities and less common  causes such as carcinoid or pheochromocytoma has been unrevealing.  He is looking into getting a smart watch or a Kardia device that he can record rhythm strips with, but he did not have any meaningful arrhythmia during 3-year long ILR monitoring. HTN: Well-controlled on amlodipine monotherapy.  Control likely to improve with additional weight loss and avoiding sodium rich foods. Prediabetes/mild hypercholesterolemia: He continues to exercise frequently.  We have discussed avoiding sugary and starchy foods.  Will have a hemoglobin A1c repeated with his primary care provider.  Although his LDL cholesterol is moderately elevated, with a calcium score of 0 I do not think he requires lipid-lowering pharmacological therapy in the absence of full-blown diabetes. Left shoulder pain/weakness: Getting physical therapy.        Medication Adjustments/Labs and Tests  Ordered: Current medicines are reviewed at length with the patient today.  Concerns regarding medicines are outlined above.  No orders of the defined types were placed in this encounter.  No orders of the defined types were placed in this encounter.   Patient Instructions  Medication Instructions:  No changes *If you need a refill on your cardiac medications before your next appointment, please call your pharmacy*  Follow-Up: At [facility], you and your health needs are our priority.  As part of our continuing mission to provide you with exceptional heart care, we have created designated Provider Care Teams.  These Care Teams include your primary Cardiologist (physician) and Advanced Practice Providers (APPs -  Physician Assistants and Nurse Practitioners) who all work together to provide you with the care you need, when you need it.  We recommend signing up for the patient portal called "MyChart".  Sign up information is provided on this After Visit Summary.  MyChart is used to connect with patients for Virtual Visits (Telemedicine).  Patients are able to view lab/test results, encounter notes, upcoming appointments, etc.  Non-urgent messages can be sent to your provider as well.   To learn more about what you can do with MyChart, go to [email or web address].    Your next appointment:    Follow up as needed  Provider:   Sanda Klein, MD       Signed, Sanda Klein, MD  12/06/2022 9:18 AM    [place]

## 2022-12-06 NOTE — Patient Instructions (Signed)
Medication Instructions:  No changes *If you need a refill on your cardiac medications before your next appointment, please call your pharmacy*  Follow-Up: At [facility], you and your health needs are our priority.  As part of our continuing mission to provide you with exceptional heart care, we have created designated Provider Care Teams.  These Care Teams include your primary Cardiologist (physician) and Advanced Practice Providers (APPs -  Physician Assistants and Nurse Practitioners) who all work together to provide you with the care you need, when you need it.  We recommend signing up for the patient portal called "MyChart".  Sign up information is provided on this After Visit Summary.  MyChart is used to connect with patients for Virtual Visits (Telemedicine).  Patients are able to view lab/test results, encounter notes, upcoming appointments, etc.  Non-urgent messages can be sent to your provider as well.   To learn more about what you can do with MyChart, go to [email or web address].    Your next appointment:    Follow up as needed  Provider:   Mihai Croitoru, MD        

## 2022-12-09 DIAGNOSIS — M25512 Pain in left shoulder: Secondary | ICD-10-CM | POA: Diagnosis not present

## 2022-12-09 DIAGNOSIS — M5412 Radiculopathy, cervical region: Secondary | ICD-10-CM | POA: Diagnosis not present

## 2022-12-09 DIAGNOSIS — R531 Weakness: Secondary | ICD-10-CM | POA: Diagnosis not present

## 2022-12-17 DIAGNOSIS — M5412 Radiculopathy, cervical region: Secondary | ICD-10-CM | POA: Diagnosis not present

## 2022-12-17 DIAGNOSIS — M4722 Other spondylosis with radiculopathy, cervical region: Secondary | ICD-10-CM | POA: Diagnosis not present

## 2022-12-19 DIAGNOSIS — M5412 Radiculopathy, cervical region: Secondary | ICD-10-CM | POA: Diagnosis not present

## 2022-12-19 DIAGNOSIS — M25512 Pain in left shoulder: Secondary | ICD-10-CM | POA: Diagnosis not present

## 2022-12-19 DIAGNOSIS — R531 Weakness: Secondary | ICD-10-CM | POA: Diagnosis not present

## 2023-01-06 DIAGNOSIS — M4722 Other spondylosis with radiculopathy, cervical region: Secondary | ICD-10-CM | POA: Diagnosis not present

## 2023-02-19 DIAGNOSIS — M47812 Spondylosis without myelopathy or radiculopathy, cervical region: Secondary | ICD-10-CM | POA: Diagnosis not present

## 2023-02-19 DIAGNOSIS — M4722 Other spondylosis with radiculopathy, cervical region: Secondary | ICD-10-CM | POA: Diagnosis not present

## 2023-02-19 DIAGNOSIS — M542 Cervicalgia: Secondary | ICD-10-CM | POA: Diagnosis not present

## 2023-02-25 DIAGNOSIS — I10 Essential (primary) hypertension: Secondary | ICD-10-CM | POA: Diagnosis not present

## 2023-02-25 DIAGNOSIS — E7800 Pure hypercholesterolemia, unspecified: Secondary | ICD-10-CM | POA: Diagnosis not present

## 2023-02-25 DIAGNOSIS — R7303 Prediabetes: Secondary | ICD-10-CM | POA: Diagnosis not present

## 2023-02-25 DIAGNOSIS — J301 Allergic rhinitis due to pollen: Secondary | ICD-10-CM | POA: Diagnosis not present

## 2023-04-15 ENCOUNTER — Ambulatory Visit (INDEPENDENT_AMBULATORY_CARE_PROVIDER_SITE_OTHER): Payer: BC Managed Care – PPO | Admitting: Allergy & Immunology

## 2023-04-15 ENCOUNTER — Encounter: Payer: Self-pay | Admitting: Allergy & Immunology

## 2023-04-15 VITALS — BP 120/80 | HR 70 | Temp 99.7°F | Resp 16 | Ht 71.0 in | Wt 209.0 lb

## 2023-04-15 DIAGNOSIS — J4520 Mild intermittent asthma, uncomplicated: Secondary | ICD-10-CM | POA: Diagnosis not present

## 2023-04-15 DIAGNOSIS — Z889 Allergy status to unspecified drugs, medicaments and biological substances status: Secondary | ICD-10-CM

## 2023-04-15 DIAGNOSIS — Z887 Allergy status to serum and vaccine status: Secondary | ICD-10-CM

## 2023-04-15 DIAGNOSIS — M6281 Muscle weakness (generalized): Secondary | ICD-10-CM | POA: Diagnosis not present

## 2023-04-15 DIAGNOSIS — T50B95D Adverse effect of other viral vaccines, subsequent encounter: Secondary | ICD-10-CM

## 2023-04-15 DIAGNOSIS — L500 Allergic urticaria: Secondary | ICD-10-CM | POA: Diagnosis not present

## 2023-04-15 NOTE — Progress Notes (Signed)
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 Muscle weakness with concern for flu vaccine allergy - The testing we will do will look for an allergy to the flu vaccine (hives, itching, throat closure, etc). - Therefore I will not be able to definitively say that additional flu vaccines will NOT cause the  Parsonage-Turner syndrome. - We will schedule you for testing in September 2024 since we will have the flu vaccine by that point in time.   2. Drug allergy - We did testing to penicillin today and it was negative. - You also tolerated the amoxicillin challenge. - Therefore we are going to remove the penicillin allergy label from your chart.  - Call us with any issues.   4. Mild intermittent asthma, uncomplicated - Lung testing not done today. - We can be more aggressive if needed in the future. - In the meantime, continue with albuterol as needed.  5. Return in about 2 months (around 06/16/2023). You can have the follow up appointment with Dr. Dellis Anes or a Nurse Practicioner (our Nurse Practitioners are excellent and always have Physician oversight!).    This note in its entirety was forwarded to the Provider who requested this consultation.  Subjective:   Juan Frank is a 57 y.o. male presenting today for evaluation of No chief complaint on file.   Juan Frank has a history of the following: Patient Active Problem List   Diagnosis Date Noted   Encounter for loop recorder at end of battery life 07/19/2019   Encounter for loop recorder check 01/01/2016   Palpitations 12/29/2013   Pre-syncope 02/19/2012    History obtained from: chart review and patient.  Juan Frank was referred by  Noberto Retort, MD.     Juan Frank is a 57 y.o. male presenting for an evaluation of possible a flu vaccine allergy .  He is here to evaluate a reaction to the flu shot. He takes an annual flu shot for decades. He got his shot in November 2023. Within one week or in December 2023, he was with sports medicine due to some pain. They thought that this was related to his neck issues. But he started having worsening pain after the flu shot and orthopedic medicine felt that this was related to getting the flu shot.   He saw Dr. 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 He has generally done better and regained strength. It did not lead to incapacitation. He was put into physical therapy which did help.   He has a long standing his of a penicillin allergy. He received it when he was a baby and got a rash. He does not get antibiotics frequently. He thinks that he has had amoxicillin.   He was diagnosed with mild asthma at some point over the last 12 months. He uses albuterol very rarely - maybe once a month or so.  He has not been on prednisone and has not been to the emergency room for his breathing.   Otherwise, there is no history of other atopic diseases, including asthma, drug allergies, stinging insect allergies, or contact dermatitis. There is no significant infectious history. Vaccinations are up to date.    Past Medical History: Patient Active  Problem List   Diagnosis Date Noted   Encounter for loop recorder at end of battery life 07/19/2019   Encounter for loop recorder check 01/01/2016   Palpitations 12/29/2013   Pre-syncope 02/19/2012    Medication List:  Allergies as of 04/15/2023   No Known Allergies      Medication List        Accurate as of April 15, 2023 11:59 PM. If you have any questions, ask your nurse or doctor.          STOP taking these medications    acetaminophen 500 MG tablet Commonly known as:  TYLENOL Stopped by: Alfonse Spruce   etodolac 400 MG tablet Commonly known as: LODINE Stopped by: Alfonse Spruce   naproxen sodium 220 MG tablet Commonly known as: ALEVE Stopped by: Dolores Hoose ARTHRITIS EXT RELIEF PO Stopped by: Alfonse Spruce       TAKE these medications    albuterol 108 (90 Base) MCG/ACT inhaler Commonly known as: VENTOLIN HFA Inhale into the lungs.   amLODipine 5 MG tablet Commonly known as: NORVASC Take 5 mg by mouth daily.   B-12 PO Take 1 tablet by mouth daily in the afternoon.   cyclobenzaprine 10 MG tablet Commonly known as: FLEXERIL Take 10 mg by mouth 3 (three) times daily as needed for muscle spasms.   gabapentin 600 MG tablet Commonly known as: NEURONTIN 1 capsule Orally Dx: M79.7 Once a day as needed What changed: Another medication with the same name was removed. Continue taking this medication, and follow the directions you see here. Changed by: Alfonse Spruce   ibuprofen 200 MG tablet Commonly known as: ADVIL Take 600 mg by mouth every 6 (six) hours as needed for moderate pain.        Birth History: non-contributory  Developmental History: non-contributory  Past Surgical History: Past Surgical History:  Procedure Laterality Date   APPENDECTOMY     ELECTROPHYSIOLOGIC STUDY N/A 10/10/2015   Procedure: Tilt Table Study;  Surgeon: Thurmon Fair, MD;  Location: [facility];  Service: Cardiovascular;  Laterality: N/A;   EP IMPLANTABLE DEVICE N/A 10/10/2015   Procedure: Loop Recorder Insertion;  Surgeon: Thurmon Fair, MD;  Location: [facility];  Service: Cardiovascular;  Laterality: N/A;   TRANSTHORACIC ECHOCARDIOGRAM  02/27/2012   EF =>55%. ATRIAL SEPTUM IS ANEURYSMAL. BORDERLINE AORTIC ROOT DILATIATION.   VASECTOMY       Family History: Family History  Problem Relation Age of Onset   Diabetes Mother    Diabetes Father    Diabetes Sister    Stroke Maternal  Grandmother    Sjogren's syndrome Sister      Social History: Cace lives at home with his family.  He lives in a house that is 57 years old.  There is hardwood in the main living areas and carpeting in the bedroom.  They have gas heating and central cooling.  There is a dog inside of the home.  There are no dust mite covers on the bedding.  There is no tobacco exposure.  They do not live near an interstate or industrial area.  He works as an Public affairs consultant.  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  He is planning to follow retire in about 3 to 4 years.   Review of systems negative other than that mentioned in the HPI     Objective:   Blood pressure 120/80, pulse 70, temperature 99.7  F (37.6 C), temperature source Temporal, resp. rate 16, height 5\' 11"  (1.803 m), weight 209 lb (94.8 kg), SpO2 93%. Body mass index is 29.15 kg/m.     Physical Exam Vitals reviewed.  Constitutional:      Appearance: He is well-developed.     Comments: Pleasant.   HENT:     Head: Normocephalic and atraumatic.     Right Ear: Tympanic membrane, ear canal and external ear normal. No drainage, swelling or tenderness. Tympanic membrane is not injected, scarred, erythematous, retracted or bulging.     Left Ear: Tympanic membrane, ear canal and external ear normal. No drainage, swelling or tenderness. Tympanic membrane is not injected, scarred, erythematous, retracted or bulging.     Nose: No nasal deformity, septal deviation, mucosal edema or rhinorrhea.     Right Nostril: No epistaxis.     Left Nostril: No epistaxis.     Right Turbinates: Not enlarged or swollen.     Left Turbinates: Not enlarged or swollen.     Right Sinus: No maxillary sinus tenderness or frontal sinus tenderness.     Left Sinus: No maxillary sinus tenderness or frontal sinus tenderness.     Mouth/Throat:     Mouth: Mucous membranes are not pale and not dry.     Pharynx: Uvula midline.  Eyes:     General:         Right eye: No discharge.        Left eye: No discharge.     Conjunctiva/sclera: Conjunctivae normal.     Right eye: Right conjunctiva is not injected. No chemosis.    Left eye: Left conjunctiva is not injected. No chemosis.    Pupils: Pupils are equal, round, and reactive to light.  Cardiovascular:     Rate and Rhythm: Normal rate and regular rhythm.     Heart sounds: Normal heart sounds.  Pulmonary:     Effort: Pulmonary effort is normal. No tachypnea, accessory muscle usage or respiratory distress.     Breath sounds: Normal breath sounds. No wheezing, rhonchi or rales.  Chest:     Chest wall: No tenderness.  Abdominal:     Tenderness: There is no abdominal tenderness. There is no guarding or rebound.  Lymphadenopathy:     Head:     Right side of head: No submandibular, tonsillar or occipital adenopathy.     Left side of head: No submandibular, tonsillar or occipital adenopathy.     Cervical: No cervical adenopathy.  Skin:    Coloration: Skin is not pale.     Findings: No abrasion, erythema, petechiae or rash. Rash is not papular, urticarial or vesicular.  Neurological:     Mental Status: He is alert.  Psychiatric:        Behavior: Behavior is cooperative.      Diagnostic studies:   Allergy Studies:     Oral Challenge - 04/15/23 1100     Challenge Food/Drug penicillin    Lot #  if Applicable [id number]    Food/Drug provided by CLINIC    Time 1006    Dose 1 mL    Lungs clear    Skin clear    Mouth clear    Time 1036    Dose 9 mL    Lungs clear    Skin clear    Mouth clear             Penicillin - 04/15/23 1154     Manufacturer Aurobindo    Lot # [id number]  Location Arm    Number of allergen test 6    Time Testing Placed 0930    Control SPT Negative    Histamine SPT 3+    Pre-Pen Puncture Negative    Penicillin-G 5000 u/ml SPT Negative    Time Testing Placed 0945    Penicillin-G 5000 u/ml Intradermal Negative             Allergy testing  results were read and interpreted by myself, documented by clinical staff.         Malachi Bonds, MD [facility]

## 2023-04-15 NOTE — Patient Instructions (Addendum)
1. Muscle weakness with concern for flu vaccine allergy - The testing we will do will look for an allergy to the flu vaccine (hives, itching, throat closure, etc). - Therefore I will not be able to definitively say that additional flu vaccines will NOT cause the  Parsonage-Turner syndrome. - We will schedule you for testing in September 2024 since we will have the flu vaccine by that point in time.   2. Drug allergy - We did testing to penicillin today and it was negative. - You also tolerated the amoxicillin challenge. - Therefore we are going to remove the penicillin allergy label from your chart.  - Call us with any issues.   4. Mild intermittent asthma, uncomplicated - Lung testing not done today. - We can be more aggressive if needed in the future. - In the meantime, continue with albuterol as needed.  5. Return in about 2 months (around 06/16/2023). You can have the follow up appointment with Dr. Dellis Anes or a Nurse Practicioner (our Nurse Practitioners are excellent and always have Physician oversight!).    Please inform us of any Emergency Department visits, hospitalizations, or changes in symptoms. Call us before going to the ED for breathing or allergy symptoms since we might be able to fit you in for a sick visit. Feel free to contact us anytime with any questions, problems, or concerns.  It was a pleasure to meet you today!  Websites that have reliable patient information: 1. American Academy of Asthma, Allergy, and Immunology: www.aaaai.org 2. Food Allergy Research and Education (FARE): foodallergy.org 3. Mothers of Asthmatics: http://www.asthmacommunitynetwork.org 4. American College of Allergy, Asthma, and Immunology: www.acaai.org   COVID-19 Vaccine Information can be found at: [email or web address].[email or web address] For questions related to vaccine distribution or appointments, please email [email or web address] or call  [phone].   We realize that you might be concerned about having an allergic reaction to the COVID19 vaccines. To help with that concern, WE ARE OFFERING THE COVID19 VACCINES IN OUR OFFICE! Ask the front desk for dates!     "Like" [place] on Facebook and Instagram for our latest updates!      A healthy democracy works best when [employer] participate! Make sure you are registered to vote! If you have moved or changed any of your contact information, you will need to get this updated before voting!  In some cases, you MAY be able to register to vote online: [email or web address]

## 2023-04-17 ENCOUNTER — Encounter: Payer: Self-pay | Admitting: Allergy & Immunology

## 2023-04-21 NOTE — Addendum Note (Signed)
Addended by: Dollene Cleveland R on: 04/21/2023 11:17 AM   Modules accepted: Orders

## 2023-06-24 ENCOUNTER — Ambulatory Visit: Payer: BC Managed Care – PPO | Admitting: Allergy & Immunology

## 2023-06-24 ENCOUNTER — Encounter: Payer: Self-pay | Admitting: Allergy & Immunology

## 2023-06-24 ENCOUNTER — Other Ambulatory Visit: Payer: Self-pay

## 2023-06-24 VITALS — BP 128/84 | HR 69 | Temp 97.7°F | Resp 18 | Ht 70.08 in | Wt 204.6 lb

## 2023-06-24 DIAGNOSIS — J4520 Mild intermittent asthma, uncomplicated: Secondary | ICD-10-CM

## 2023-06-24 DIAGNOSIS — Z887 Allergy status to serum and vaccine status: Secondary | ICD-10-CM | POA: Diagnosis not present

## 2023-06-24 DIAGNOSIS — T50B95D Adverse effect of other viral vaccines, subsequent encounter: Secondary | ICD-10-CM

## 2023-06-24 NOTE — Progress Notes (Signed)
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 Muscle weakness with concern for flu vaccine allergy - The testing we will do will look for an allergy to the flu vaccine (hives, itching, throat closure, etc). - Therefore I will not be able to definitively say that additional flu vaccines will NOT cause the  Parsonage-Turner syndrome. - Prick testing was NEGATIVE to the vaccination. - We will revisit getting the injection next year. - If you are concerned that you might have flu, please get tested and we can start an antiviral.   2. Drug allergy - passed penicillin challenge at the last visit - Continue to use penicillin as needed if you have an infection.   4. Mild intermittent asthma, uncomplicated - Lung testing not done today. - We can be more aggressive if needed in the future. - In the meantime, continue with albuterol as needed.  5. Return in about 1 year (around 06/23/2024).   Subjective:   Juan Frank is a 57 y.o. male presenting today for follow up of  Chief Complaint  Patient presents with   Food/Drug Challenge    Flu Vaccine     Juan Frank has a history of the following: Patient Active Problem List   Diagnosis Date Noted   Encounter for loop recorder at end of battery life 07/19/2019   Encounter for loop recorder check 01/01/2016   Palpitations 12/29/2013   Pre-syncope 02/19/2012    History obtained from: chart review and patient.  Juan Frank is a 57 y.o. male presenting for skin testing.  He was last seen in July 2024 for an evaluation of the concern for an allergic reaction to the flu vaccine.  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  He decided to make an appointment to test for the flu shot via skin testing and presents today for that.  We did do penicillin drug testing and the challenge at the last visit and it was completely negative; he did well after the last visit and has not needed antibiotics since the last time we saw him.  Asthma is controlled with albuterol as needed.  Since the last visit, he has done well.  He is not sure he wants to do the intradermal portion of the testing today.  He is afraid that this will just trigger the Parsonage-Turner syndrome again and he would prefer to wait for an actual injection until 2025.  He is fine with doing the prick testing, however.  Otherwise, there have been no changes to his past medical history, surgical history, family history, or social history.    Review of systems otherwise negative other than that mentioned in the HPI.    Objective:   Blood pressure 128/84, pulse 69, temperature 97.7 F (36.5 C), resp. rate 18, height 5' 10.08" (1.78 m), weight 204 lb 9.6 oz (92.8 kg), SpO2 97%. Body mass index is 29.29 kg/m.    Physical Exam Vitals reviewed.  Constitutional:      Appearance: He is well-developed.     Comments: Pleasant.   HENT:     Head: Normocephalic and atraumatic.     Right Ear: Tympanic membrane, ear canal and external ear normal. No  drainage, swelling or tenderness. Tympanic membrane is not injected, scarred, erythematous, retracted or bulging.     Left Ear: Tympanic membrane, ear canal and external ear normal. No drainage, swelling or tenderness. Tympanic membrane is not injected, scarred, erythematous, retracted or bulging.     Nose: No nasal deformity, septal deviation, mucosal edema or rhinorrhea.     Right Nostril: No epistaxis.     Left Nostril: No epistaxis.     Right Turbinates: Not enlarged or swollen.     Left Turbinates: Not enlarged or swollen.     Right Sinus: No maxillary sinus tenderness or frontal sinus tenderness.      Left Sinus: No maxillary sinus tenderness or frontal sinus tenderness.     Mouth/Throat:     Mouth: Mucous membranes are not pale and not dry.     Pharynx: Uvula midline.  Eyes:     General:        Right eye: No discharge.        Left eye: No discharge.     Conjunctiva/sclera: Conjunctivae normal.     Right eye: Right conjunctiva is not injected. No chemosis.    Left eye: Left conjunctiva is not injected. No chemosis.    Pupils: Pupils are equal, round, and reactive to light.  Cardiovascular:     Rate and Rhythm: Normal rate and regular rhythm.     Heart sounds: Normal heart sounds.  Pulmonary:     Effort: Pulmonary effort is normal. No tachypnea, accessory muscle usage or respiratory distress.     Breath sounds: Normal breath sounds. No wheezing, rhonchi or rales.  Chest:     Chest wall: No tenderness.  Lymphadenopathy:     Head:     Right side of head: No submandibular, tonsillar or occipital adenopathy.     Left side of head: No submandibular, tonsillar or occipital adenopathy.     Cervical: No cervical adenopathy.  Skin:    Coloration: Skin is not pale.     Findings: No abrasion, erythema, petechiae or rash. Rash is not papular, urticarial or vesicular.  Neurological:     Mental Status: He is alert.  Psychiatric:        Behavior: Behavior is cooperative.      Diagnostic studies:   Allergy Studies:     COVID Vaccine Testing - 06/24/23 1214       Test Information   Consent Yes    Medications --   FLU VACCINE     Pre Test Vitals   BP 128/84    Pulse 69    Resp 18      SKIN PRICK TESTING - Arm #1   Location Right Arm;Other (Comment)   Flu skin prick negative     HISTAMINE (1mg /mL) Skin Prick Arm #1   Histamine Time Testing Placed 1152    Histamine Wheal 3+      Control (negative - HSA) Skin Prick Arm #1   Control Time Testing Placed 1152    Control Wheal Negative             Allergy testing results were read and interpreted by myself, documented by  clinical staff.      Malachi Bonds, MD  [facility]

## 2023-06-24 NOTE — Patient Instructions (Addendum)
1. Muscle weakness with concern for flu vaccine allergy - The testing we will do will look for an allergy to the flu vaccine (hives, itching, throat closure, etc). - Therefore I will not be able to definitively say that additional flu vaccines will NOT cause the  Parsonage-Turner syndrome. - Prick testing was NEGATIVE to the vaccination. - We will revisit getting the injection next year. - If you are concerned that you might have flu, please get tested and we can start an antiviral.   2. Drug allergy - passed penicillin challenge at the last visit - Continue to use penicillin as needed if you have an infection.   4. Mild intermittent asthma, uncomplicated - Lung testing not done today. - We can be more aggressive if needed in the future. - In the meantime, continue with albuterol as needed.  5. Return in about 1 year (around 06/23/2024).    Please inform us of any Emergency Department visits, hospitalizations, or changes in symptoms. Call us before going to the ED for breathing or allergy symptoms since we might be able to fit you in for a sick visit. Feel free to contact us anytime with any questions, problems, or concerns.  It was a pleasure to see you again today!  Websites that have reliable patient information: 1. American Academy of Asthma, Allergy, and Immunology: www.aaaai.org 2. Food Allergy Research and Education (FARE): foodallergy.org 3. Mothers of Asthmatics: http://www.asthmacommunitynetwork.org 4. American College of Allergy, Asthma, and Immunology: www.acaai.org   COVID-19 Vaccine Information can be found at: [email or web address] For questions related to vaccine distribution or appointments, please email [email or web address] or call [phone].   We realize that you might be concerned about having an allergic reaction to the COVID19 vaccines. To help with that concern, WE ARE OFFERING THE COVID19 VACCINES IN  OUR OFFICE! Ask the front desk for dates!     "Like" [place] on Facebook and Instagram for our latest updates!      A healthy democracy works best when [employer] participate! Make sure you are registered to vote! If you have moved or changed any of your contact information, you will need to get this updated before voting!  In some cases, you MAY be able to register to vote online: [email or web address]

## 2023-09-17 DIAGNOSIS — M542 Cervicalgia: Secondary | ICD-10-CM | POA: Diagnosis not present

## 2023-09-17 DIAGNOSIS — M25512 Pain in left shoulder: Secondary | ICD-10-CM | POA: Diagnosis not present

## 2023-10-07 DIAGNOSIS — Z125 Encounter for screening for malignant neoplasm of prostate: Secondary | ICD-10-CM | POA: Diagnosis not present

## 2023-10-07 DIAGNOSIS — E7800 Pure hypercholesterolemia, unspecified: Secondary | ICD-10-CM | POA: Diagnosis not present

## 2023-10-07 DIAGNOSIS — R7303 Prediabetes: Secondary | ICD-10-CM | POA: Diagnosis not present

## 2023-10-07 DIAGNOSIS — I10 Essential (primary) hypertension: Secondary | ICD-10-CM | POA: Diagnosis not present

## 2023-10-07 DIAGNOSIS — Z0000 Encounter for general adult medical examination without abnormal findings: Secondary | ICD-10-CM | POA: Diagnosis not present

## 2023-10-09 DIAGNOSIS — U071 COVID-19: Secondary | ICD-10-CM | POA: Diagnosis not present

## 2023-10-09 DIAGNOSIS — R0981 Nasal congestion: Secondary | ICD-10-CM | POA: Diagnosis not present

## 2023-10-09 DIAGNOSIS — R059 Cough, unspecified: Secondary | ICD-10-CM | POA: Diagnosis not present

## 2023-10-13 ENCOUNTER — Telehealth: Payer: Self-pay | Admitting: Allergy & Immunology

## 2023-10-13 NOTE — Telephone Encounter (Signed)
 Ray from [facility] called and stated that patients claim for date of service 04/15/23 in the amount of 1250.00. the denied code is 95076. He is requesting a coding review and would like the patient called back with the results. Patients call back number is [phone]

## 2023-10-14 NOTE — Telephone Encounter (Signed)
 Ray from [facility] is calling back to follow up on this issue.

## 2023-11-03 DIAGNOSIS — G478 Other sleep disorders: Secondary | ICD-10-CM | POA: Diagnosis not present

## 2023-12-08 DIAGNOSIS — G4733 Obstructive sleep apnea (adult) (pediatric): Secondary | ICD-10-CM | POA: Diagnosis not present

## 2023-12-17 DIAGNOSIS — G4733 Obstructive sleep apnea (adult) (pediatric): Secondary | ICD-10-CM | POA: Diagnosis not present

## 2024-01-01 NOTE — Telephone Encounter (Signed)
 I received a rather heated letter today from this patient. Was there a coding issue on my end?   Juan Bonds, MD [facility]

## 2024-02-26 DIAGNOSIS — G4733 Obstructive sleep apnea (adult) (pediatric): Secondary | ICD-10-CM | POA: Diagnosis not present

## 2024-03-15 DIAGNOSIS — G4733 Obstructive sleep apnea (adult) (pediatric): Secondary | ICD-10-CM | POA: Diagnosis not present

## 2024-03-18 DIAGNOSIS — J40 Bronchitis, not specified as acute or chronic: Secondary | ICD-10-CM | POA: Diagnosis not present

## 2024-04-06 DIAGNOSIS — J4520 Mild intermittent asthma, uncomplicated: Secondary | ICD-10-CM | POA: Diagnosis not present

## 2024-04-06 DIAGNOSIS — E7800 Pure hypercholesterolemia, unspecified: Secondary | ICD-10-CM | POA: Diagnosis not present

## 2024-04-06 DIAGNOSIS — I10 Essential (primary) hypertension: Secondary | ICD-10-CM | POA: Diagnosis not present

## 2024-04-06 DIAGNOSIS — R7303 Prediabetes: Secondary | ICD-10-CM | POA: Diagnosis not present

## 2024-04-26 DIAGNOSIS — G4733 Obstructive sleep apnea (adult) (pediatric): Secondary | ICD-10-CM | POA: Diagnosis not present

## 2024-05-12 DIAGNOSIS — M25531 Pain in right wrist: Secondary | ICD-10-CM | POA: Diagnosis not present

## 2024-05-19 DIAGNOSIS — G4733 Obstructive sleep apnea (adult) (pediatric): Secondary | ICD-10-CM | POA: Diagnosis not present

## 2024-06-24 ENCOUNTER — Ambulatory Visit: Payer: BC Managed Care – PPO | Admitting: Allergy & Immunology

## 2024-08-09 DIAGNOSIS — W57XXXA Bitten or stung by nonvenomous insect and other nonvenomous arthropods, initial encounter: Secondary | ICD-10-CM | POA: Diagnosis not present

## 2024-08-09 DIAGNOSIS — R5383 Other fatigue: Secondary | ICD-10-CM | POA: Diagnosis not present

## 2024-08-09 DIAGNOSIS — I10 Essential (primary) hypertension: Secondary | ICD-10-CM | POA: Diagnosis not present

## 2024-08-09 DIAGNOSIS — R7303 Prediabetes: Secondary | ICD-10-CM | POA: Diagnosis not present

## 2024-08-09 DIAGNOSIS — M2550 Pain in unspecified joint: Secondary | ICD-10-CM | POA: Diagnosis not present

## 2024-09-08 DIAGNOSIS — M542 Cervicalgia: Secondary | ICD-10-CM | POA: Diagnosis not present

## 2024-09-14 DIAGNOSIS — M542 Cervicalgia: Secondary | ICD-10-CM | POA: Diagnosis not present

## 2024-09-27 DIAGNOSIS — M542 Cervicalgia: Secondary | ICD-10-CM | POA: Diagnosis not present
